# Patient Record
Sex: Female | Born: 1945 | Race: White | Hispanic: No | Marital: Married | Smoking: Never smoker
Health system: Southern US, Community
[De-identification: ages and names within clinical notes are randomized; demographics above are authoritative.]

## PROBLEM LIST (undated history)

## (undated) DIAGNOSIS — N950 Postmenopausal bleeding: Secondary | ICD-10-CM

## (undated) DIAGNOSIS — I10 Essential (primary) hypertension: Secondary | ICD-10-CM

## (undated) DIAGNOSIS — C4491 Basal cell carcinoma of skin, unspecified: Secondary | ICD-10-CM

## (undated) DIAGNOSIS — M1990 Unspecified osteoarthritis, unspecified site: Secondary | ICD-10-CM

## (undated) DIAGNOSIS — Z789 Other specified health status: Secondary | ICD-10-CM

## (undated) DIAGNOSIS — F32A Depression, unspecified: Secondary | ICD-10-CM

## (undated) DIAGNOSIS — F329 Major depressive disorder, single episode, unspecified: Secondary | ICD-10-CM

## (undated) DIAGNOSIS — E7800 Pure hypercholesterolemia, unspecified: Secondary | ICD-10-CM

## (undated) DIAGNOSIS — M85859 Other specified disorders of bone density and structure, unspecified thigh: Secondary | ICD-10-CM

## (undated) DIAGNOSIS — F419 Anxiety disorder, unspecified: Secondary | ICD-10-CM

## (undated) HISTORY — DX: Postmenopausal bleeding: N95.0

## (undated) HISTORY — PX: FOOT SURGERY: SHX648

## (undated) HISTORY — PX: TUBAL LIGATION: SHX77

## (undated) HISTORY — DX: Pure hypercholesterolemia, unspecified: E78.00

## (undated) HISTORY — DX: Basal cell carcinoma of skin, unspecified: C44.91

## (undated) HISTORY — PX: LASIK: SHX215

## (undated) HISTORY — DX: Other specified disorders of bone density and structure, unspecified thigh: M85.859

## (undated) HISTORY — PX: ENDOMETRIAL BIOPSY: SHX622

## (undated) HISTORY — PX: COLONOSCOPY: SHX174

---

## 1973-11-04 HISTORY — PX: OTHER SURGICAL HISTORY: SHX169

## 2000-03-05 ENCOUNTER — Other Ambulatory Visit: Admission: RE | Admit: 2000-03-05 | Discharge: 2000-03-05 | Payer: Self-pay | Admitting: Obstetrics and Gynecology

## 2001-03-25 ENCOUNTER — Other Ambulatory Visit: Admission: RE | Admit: 2001-03-25 | Discharge: 2001-03-25 | Payer: Self-pay | Admitting: Obstetrics and Gynecology

## 2002-03-25 ENCOUNTER — Other Ambulatory Visit: Admission: RE | Admit: 2002-03-25 | Discharge: 2002-03-25 | Payer: Self-pay | Admitting: Obstetrics and Gynecology

## 2003-04-27 ENCOUNTER — Other Ambulatory Visit: Admission: RE | Admit: 2003-04-27 | Discharge: 2003-04-27 | Payer: Self-pay | Admitting: Obstetrics and Gynecology

## 2003-08-29 ENCOUNTER — Ambulatory Visit (HOSPITAL_COMMUNITY): Admission: RE | Admit: 2003-08-29 | Discharge: 2003-08-29 | Payer: Self-pay | Admitting: Gastroenterology

## 2003-09-19 ENCOUNTER — Ambulatory Visit (HOSPITAL_COMMUNITY): Admission: RE | Admit: 2003-09-19 | Discharge: 2003-09-19 | Payer: Self-pay | Admitting: Obstetrics and Gynecology

## 2004-05-15 ENCOUNTER — Other Ambulatory Visit: Admission: RE | Admit: 2004-05-15 | Discharge: 2004-05-15 | Payer: Self-pay | Admitting: Obstetrics and Gynecology

## 2004-07-05 ENCOUNTER — Emergency Department (HOSPITAL_COMMUNITY): Admission: EM | Admit: 2004-07-05 | Discharge: 2004-07-05 | Payer: Self-pay | Admitting: Emergency Medicine

## 2004-11-23 ENCOUNTER — Ambulatory Visit (HOSPITAL_COMMUNITY): Admission: RE | Admit: 2004-11-23 | Discharge: 2004-11-23 | Payer: Self-pay | Admitting: Obstetrics and Gynecology

## 2005-07-19 ENCOUNTER — Other Ambulatory Visit: Admission: RE | Admit: 2005-07-19 | Discharge: 2005-07-19 | Payer: Self-pay | Admitting: Obstetrics and Gynecology

## 2005-12-26 ENCOUNTER — Ambulatory Visit (HOSPITAL_COMMUNITY): Admission: RE | Admit: 2005-12-26 | Discharge: 2005-12-26 | Payer: Self-pay | Admitting: Obstetrics and Gynecology

## 2006-08-20 ENCOUNTER — Other Ambulatory Visit: Admission: RE | Admit: 2006-08-20 | Discharge: 2006-08-20 | Payer: Self-pay | Admitting: Obstetrics and Gynecology

## 2006-12-30 ENCOUNTER — Ambulatory Visit (HOSPITAL_COMMUNITY): Admission: RE | Admit: 2006-12-30 | Discharge: 2006-12-30 | Payer: Self-pay | Admitting: Obstetrics and Gynecology

## 2007-09-23 ENCOUNTER — Other Ambulatory Visit: Admission: RE | Admit: 2007-09-23 | Discharge: 2007-09-23 | Payer: Self-pay | Admitting: Obstetrics and Gynecology

## 2008-01-19 ENCOUNTER — Ambulatory Visit (HOSPITAL_COMMUNITY): Admission: RE | Admit: 2008-01-19 | Discharge: 2008-01-19 | Payer: Self-pay | Admitting: Obstetrics and Gynecology

## 2009-01-17 ENCOUNTER — Other Ambulatory Visit: Admission: RE | Admit: 2009-01-17 | Discharge: 2009-01-17 | Payer: Self-pay | Admitting: Obstetrics and Gynecology

## 2009-01-19 ENCOUNTER — Ambulatory Visit (HOSPITAL_COMMUNITY): Admission: RE | Admit: 2009-01-19 | Discharge: 2009-01-19 | Payer: Self-pay | Admitting: Obstetrics and Gynecology

## 2010-02-16 ENCOUNTER — Emergency Department (HOSPITAL_COMMUNITY): Admission: EM | Admit: 2010-02-16 | Discharge: 2010-02-17 | Payer: Self-pay | Admitting: Emergency Medicine

## 2010-02-19 ENCOUNTER — Emergency Department (HOSPITAL_COMMUNITY): Admission: EM | Admit: 2010-02-19 | Discharge: 2010-02-19 | Payer: Self-pay | Admitting: Family Medicine

## 2010-05-09 ENCOUNTER — Ambulatory Visit (HOSPITAL_COMMUNITY): Admission: RE | Admit: 2010-05-09 | Discharge: 2010-05-09 | Payer: Self-pay | Admitting: Family Medicine

## 2011-03-22 NOTE — Op Note (Signed)
   NAMECARROL, Sharon Hardin                         ACCOUNT NO.:  [id number]   MEDICAL RECORD NO.:  [id number]                   PATIENT TYPE:  AMB   LOCATION:  ENDO                                 FACILITY:  [facility]   PHYSICIAN:  Anselmo Rod, M.D.               DATE OF BIRTH:  1946/03/09   DATE OF PROCEDURE:  08/29/2003  DATE OF DISCHARGE:                                 OPERATIVE REPORT   PROCEDURE:  Screening colonoscopy.   ENDOSCOPIST:  Anselmo Rod, M.D.   INSTRUMENT USED:  Olympus video colonoscope.   INDICATIONS FOR PROCEDURE:  Screening colonoscopy being performed in a 21-  year-old female,  rule out colonic polyps, masses, etc.   PREPROCEDURE PREPARATION:  Informed consent was obtained from the patient  and the patient was fasted for 8 hours prior to  the procedure and prepped  with a bottle of magnesium citrate and a gallon of GoLYTELY the  night prior  to the procedure.   PREPROCEDURE PHYSICAL:  The patient had stable vital signs. Neck supple.  Chest clear to auscultation, S1, S2 regular. Abdomen soft with normoactive  bowel sounds.   DESCRIPTION OF PROCEDURE:  The patient was placed in the left lateral  decubitus position and sedated with 100 mg of Demerol and 10 mg of Versed  intravenously. Once sedation was adequate, the patient was maintained on low  flow oxygen and continuous cardiac monitoring.   The Olympus video colonoscope was advanced from the rectum to the cecum. No  masses, polyps, erosions or ulcerations or diverticula were seen.  The  appendiceal orifice and ileocecal valve were clearly visualized and  photographed. Small internal hemorrhoids were appreciated on retroflexion in  the rectum. The patient tolerated the procedure well without complications.   IMPRESSION:  1. Normal colonoscopy to the cecum except for small  internal hemorrhoids.  2. No masses, polyps or diverticula were seen.   RECOMMENDATIONS:  1. Repeat colorectal cancer screening  is recommended in the next 10 years     unless the patient develops any abnormal symptoms in the interim.  2.     A high fiber diet with liberal fluid intake has been advised.  3. Outpatient follow up as need arises in the future.                                                Anselmo Rod, M.D.    JNM/MEDQ  D:  08/29/2003  T:  08/29/2003  Job:  [id number]   cc:   Aram Beecham P. Romine, M.D.  [address], [address]  [place]  [place] [address]  Fax: [phone]

## 2011-07-10 ENCOUNTER — Other Ambulatory Visit: Payer: Self-pay | Admitting: Obstetrics and Gynecology

## 2011-07-10 DIAGNOSIS — Z1231 Encounter for screening mammogram for malignant neoplasm of breast: Secondary | ICD-10-CM

## 2011-08-08 ENCOUNTER — Ambulatory Visit (HOSPITAL_COMMUNITY): Payer: Self-pay

## 2011-08-27 ENCOUNTER — Ambulatory Visit (HOSPITAL_COMMUNITY)
Admission: RE | Admit: 2011-08-27 | Discharge: 2011-08-27 | Disposition: A | Discharge status: Home / Self Care | Payer: PRIVATE HEALTH INSURANCE | Source: Ambulatory Visit | Attending: Obstetrics and Gynecology | Admitting: Obstetrics and Gynecology

## 2011-08-27 DIAGNOSIS — Z1231 Encounter for screening mammogram for malignant neoplasm of breast: Secondary | ICD-10-CM

## 2012-03-02 ENCOUNTER — Encounter (HOSPITAL_COMMUNITY): Payer: Self-pay | Admitting: Pharmacist

## 2012-03-02 NOTE — H&P (Signed)
Sharon Hardin is an 66 y.o. female.   Chief Complaint:bleeding HPI: [facility]  G2P2 with 15 days of bleeding and the end of March.  Had been on HRT from [facility] with implants, the last one being in Oct 2012.  On PUS uteris 9 x 4 x 5 cm and adnexa normal.  SHSG showed two polyps, one 1 cm and one 2 cm dia.    No past medical history on file.  No past surgical history on file.  No family history on file. Social History:  does not have a smoking history on file. She does not have any smokeless tobacco history on file. Her alcohol and drug histories not on file.Getting married in June 2013!  Allergies: No Known Allergies  No prescriptions prior to admission    No results found for this or any previous visit (from the past 48 hour(s)). No results found.  Review of Systems  All other systems reviewed and are negative.    There were no vitals taken for this visit. Physical Exam  Constitutional: She is oriented to person, place, and time. She appears well-developed and well-nourished.  HENT:  Head: Normocephalic and atraumatic.  Eyes: Conjunctivae are normal.  Neck: Neck supple. No thyromegaly present.  Cardiovascular: Normal rate, regular rhythm and normal heart sounds.   Respiratory: Effort normal and breath sounds normal.  GI: Soft.  Genitourinary: Vagina normal and uterus normal.  Musculoskeletal: Normal range of motion.  Neurological: She is alert and oriented to person, place, and time.  Skin: Skin is warm and dry.  Psychiatric: She has a normal mood and affect.     Assessment/Plan Post menopausal bleeding.  Plan hysteroscopic resection of polyps, D & C.  Sharon Hardin P 03/02/2012, 2:41 PM

## 2012-03-05 ENCOUNTER — Encounter (HOSPITAL_COMMUNITY): Payer: Self-pay

## 2012-03-05 ENCOUNTER — Encounter (HOSPITAL_COMMUNITY)
Admission: RE | Admit: 2012-03-05 | Discharge: 2012-03-05 | Disposition: A | Discharge status: Home / Self Care | Payer: BC Managed Care – PPO | Source: Ambulatory Visit | Attending: Obstetrics and Gynecology | Admitting: Obstetrics and Gynecology

## 2012-03-05 HISTORY — DX: Other specified health status: Z78.9

## 2012-03-05 LAB — CBC
HCT: 40.9 % (ref 36.0–46.0)
Hemoglobin: 13.2 g/dL (ref 12.0–15.0)
MCH: 30.6 pg (ref 26.0–34.0)
MCHC: 32.3 g/dL (ref 30.0–36.0)
MCV: 94.7 fL (ref 78.0–100.0)
Platelets: 382 10*3/uL (ref 150–400)
RBC: 4.32 MIL/uL (ref 3.87–5.11)
RDW: 13 % (ref 11.5–15.5)
WBC: 8.1 10*3/uL (ref 4.0–10.5)

## 2012-03-05 NOTE — Patient Instructions (Addendum)
20 Sharon Hardin  03/05/2012   Your procedure is scheduled on:  03/10/12  Enter through the Main Entrance of [facility] at 1030 AM.  Pick up the phone at the desk and dial [phone].   Call this number if you have problems the morning of surgery: [phone]   Remember:   Do not eat food:After Midnight.  Do not drink clear liquids: 4 Hours before arrival.  Take these medicines the morning of surgery with A SIP OF WATER: NA   Do not wear jewelry, make-up or nail polish.  Do not wear lotions, powders, or perfumes. You may wear deodorant.  Do not shave 48 hours prior to surgery.  Do not bring valuables to the hospital.  Contacts, dentures or bridgework may not be worn into surgery.  Leave suitcase in the car. After surgery it may be brought to your room.  For patients admitted to the hospital, checkout time is 11:00 AM the day of discharge.   Patients discharged the day of surgery will not be allowed to drive home.  Name and phone number of your driver: NA  Special Instructions: CHG Shower Use Special Wash: 1/2 bottle night before surgery and 1/2 bottle morning of surgery.   Please read over the following fact sheets that you were given: Surgical Site Infection Prevention

## 2012-03-10 ENCOUNTER — Encounter (HOSPITAL_COMMUNITY): Payer: Self-pay | Admitting: Anesthesiology

## 2012-03-10 ENCOUNTER — Encounter (HOSPITAL_COMMUNITY)
Admission: RE | Disposition: A | Discharge status: Home / Self Care | Payer: Self-pay | Source: Ambulatory Visit | Attending: Obstetrics and Gynecology

## 2012-03-10 ENCOUNTER — Ambulatory Visit (HOSPITAL_COMMUNITY)
Admission: RE | Admit: 2012-03-10 | Discharge: 2012-03-10 | Disposition: A | Discharge status: Home / Self Care | Payer: BC Managed Care – PPO | Source: Ambulatory Visit | Attending: Obstetrics and Gynecology | Admitting: Obstetrics and Gynecology

## 2012-03-10 ENCOUNTER — Ambulatory Visit (HOSPITAL_COMMUNITY): Payer: BC Managed Care – PPO | Admitting: Anesthesiology

## 2012-03-10 DIAGNOSIS — Z01818 Encounter for other preprocedural examination: Secondary | ICD-10-CM | POA: Insufficient documentation

## 2012-03-10 DIAGNOSIS — Z01812 Encounter for preprocedural laboratory examination: Secondary | ICD-10-CM | POA: Insufficient documentation

## 2012-03-10 DIAGNOSIS — N840 Polyp of corpus uteri: Secondary | ICD-10-CM | POA: Insufficient documentation

## 2012-03-10 DIAGNOSIS — N950 Postmenopausal bleeding: Secondary | ICD-10-CM

## 2012-03-10 HISTORY — PX: HYSTEROSCOPY: SHX211

## 2012-03-10 HISTORY — DX: Postmenopausal bleeding: N95.0

## 2012-03-10 SURGERY — DILATATION & CURETTAGE/HYSTEROSCOPY WITH RESECTOCOPE
Anesthesia: General | Site: Uterus | Wound class: Clean Contaminated

## 2012-03-10 MED ORDER — DEXAMETHASONE SODIUM PHOSPHATE 10 MG/ML IJ SOLN
INTRAMUSCULAR | Status: AC
  Filled 2012-03-10: qty 1

## 2012-03-10 MED ORDER — MIDAZOLAM HCL 2 MG/2ML IJ SOLN
INTRAMUSCULAR | Status: AC
  Filled 2012-03-10: qty 2

## 2012-03-10 MED ORDER — LIDOCAINE HCL 1 % IJ SOLN
INTRAMUSCULAR | Status: DC | PRN
  Administered 2012-03-10: 20 mL

## 2012-03-10 MED ORDER — PROPOFOL 10 MG/ML IV EMUL
INTRAVENOUS | Status: DC | PRN
  Administered 2012-03-10: 200 mg via INTRAVENOUS

## 2012-03-10 MED ORDER — MIDAZOLAM HCL 5 MG/5ML IJ SOLN
INTRAMUSCULAR | Status: DC | PRN
  Administered 2012-03-10: 2 mg via INTRAVENOUS

## 2012-03-10 MED ORDER — LIDOCAINE HCL (CARDIAC) 20 MG/ML IV SOLN
INTRAVENOUS | Status: AC
  Filled 2012-03-10: qty 5

## 2012-03-10 MED ORDER — FENTANYL CITRATE 0.05 MG/ML IJ SOLN
INTRAMUSCULAR | Status: DC | PRN
  Administered 2012-03-10 (×2): 50 ug via INTRAVENOUS

## 2012-03-10 MED ORDER — KETOROLAC TROMETHAMINE 30 MG/ML IJ SOLN
INTRAMUSCULAR | Status: DC | PRN
  Administered 2012-03-10: 30 mg via INTRAVENOUS

## 2012-03-10 MED ORDER — FENTANYL CITRATE 0.05 MG/ML IJ SOLN
25.0000 ug | INTRAMUSCULAR | Status: DC | PRN
  Administered 2012-03-10: 50 ug via INTRAVENOUS

## 2012-03-10 MED ORDER — LIDOCAINE HCL (CARDIAC) 20 MG/ML IV SOLN
INTRAVENOUS | Status: DC | PRN
  Administered 2012-03-10: 60 mg via INTRAVENOUS

## 2012-03-10 MED ORDER — FENTANYL CITRATE 0.05 MG/ML IJ SOLN
INTRAMUSCULAR | Status: AC
  Filled 2012-03-10: qty 2

## 2012-03-10 MED ORDER — GLYCOPYRROLATE 0.2 MG/ML IJ SOLN
INTRAMUSCULAR | Status: AC
  Filled 2012-03-10: qty 1

## 2012-03-10 MED ORDER — ONDANSETRON HCL 4 MG/2ML IJ SOLN
INTRAMUSCULAR | Status: AC
  Filled 2012-03-10: qty 2

## 2012-03-10 MED ORDER — GLYCINE 1.5 % IR SOLN
Status: DC | PRN
  Administered 2012-03-10: 3000 mL

## 2012-03-10 MED ORDER — PROPOFOL 10 MG/ML IV EMUL
INTRAVENOUS | Status: AC
  Filled 2012-03-10: qty 20

## 2012-03-10 MED ORDER — FENTANYL CITRATE 0.05 MG/ML IJ SOLN
INTRAMUSCULAR | Status: AC
  Filled 2012-03-10: qty 5

## 2012-03-10 MED ORDER — FENTANYL CITRATE 0.05 MG/ML IJ SOLN
INTRAMUSCULAR | Status: AC
  Administered 2012-03-10: 50 ug via INTRAVENOUS
  Filled 2012-03-10: qty 2

## 2012-03-10 MED ORDER — LACTATED RINGERS IV SOLN
INTRAVENOUS | Status: DC
  Administered 2012-03-10: 11:00:00 via INTRAVENOUS

## 2012-03-10 MED ORDER — KETOROLAC TROMETHAMINE 30 MG/ML IJ SOLN: 15.0000 mg | Freq: Once | INTRAMUSCULAR | Status: DC | PRN

## 2012-03-10 MED ORDER — ONDANSETRON HCL 4 MG/2ML IJ SOLN
INTRAMUSCULAR | Status: DC | PRN
  Administered 2012-03-10: 4 mg via INTRAVENOUS

## 2012-03-10 MED ORDER — DEXAMETHASONE SODIUM PHOSPHATE 4 MG/ML IJ SOLN
INTRAMUSCULAR | Status: DC | PRN
  Administered 2012-03-10: 10 mg via INTRAVENOUS

## 2012-03-10 SURGICAL SUPPLY — 19 items
CANISTER SUCTION 2500CC (MISCELLANEOUS) ×2 IMPLANT
CATH ROBINSON RED A/P 16FR (CATHETERS) ×2 IMPLANT
CONTAINER PREFILL 10% NBF 60ML (FORM) ×3 IMPLANT
CORD ACTIVE DISPOSABLE (ELECTRODE) ×1
CORD ELECTRO ACTIVE DISP (ELECTRODE) ×1 IMPLANT
DECANTER SPIKE VIAL GLASS SM (MISCELLANEOUS) ×1 IMPLANT
ELECT LOOP GYNE PRO 24FR (CUTTING LOOP) ×2
ELECT REM PT RETURN 9FT ADLT (ELECTROSURGICAL) ×2
ELECT VAPORTRODE GRVD BAR (ELECTRODE) IMPLANT
ELECTRODE LOOP GYNE PRO 24FR (CUTTING LOOP) IMPLANT
ELECTRODE REM PT RTRN 9FT ADLT (ELECTROSURGICAL) ×1 IMPLANT
GLOVE BIOGEL PI IND STRL 7.0 (GLOVE) ×2 IMPLANT
GLOVE BIOGEL PI INDICATOR 7.0 (GLOVE) ×2
GLOVE ECLIPSE 6.5 STRL STRAW (GLOVE) ×2 IMPLANT
GOWN PREVENTION PLUS LG XLONG (DISPOSABLE) ×3 IMPLANT
GOWN STRL REIN XL XLG (GOWN DISPOSABLE) ×2 IMPLANT
PACK HYSTEROSCOPY LF (CUSTOM PROCEDURE TRAY) ×2 IMPLANT
TOWEL OR 17X24 6PK STRL BLUE (TOWEL DISPOSABLE) ×4 IMPLANT
WATER STERILE IRR 1000ML POUR (IV SOLUTION) ×2 IMPLANT

## 2012-03-10 NOTE — Op Note (Signed)
Pre-Op Dx: Post menopausal bleeding, endometrial polyps on sonohystogram Post-Op Dx:  Same, path pending Procedure:  Hysteroscopy with resection of polyps, D & C Surgeon:  Aram Beecham Latima Hamza Anes:  Gen by LMA EBL: minimal Complications: none Glycine deficit:  105 cc Procedure:  The patient was taken to the operating room, and after the induction of adequate general anesthesia by LMA, she was placed in the dorsal lithotomy position and prepped and draped in the usual fashion.  The bladder was drained with a red rubber catheter during the prep.  A posterior weighted and anterior Sims retractor were placed, and the cervix was grasped on the anterior lip with a single tooth tenaculum.  Paracervical block was instituted by injecting 10 cc of 1% xylocaine at each of 3 and 9 o'clock.  The uterus sounded to 8.5 cm   .  The cervix was dilated to a # 31 Pratt.  Polyp forceps were introduced and segments to two polyps were removed.   The operative hysteroscope with a single loop was inserted into the endometrial cavity under direct visualization.  Cautery was used to remove several segments of remaining polyps, and the scope was withdrawn.  Gentle sharp curettage was then carried out, and the specimen sent to pathology.  The instruments were removed from the vagina, and the procedure was terminated.  Sponge, needle, and instrument counts were correct.  The patient tolerated the procedure well, and went to the recovery room in satisfactory condition.

## 2012-03-10 NOTE — Discharge Instructions (Signed)
Hysteroscopy  Hysteroscopy is a procedure used for looking inside the womb (uterus). It may be done for many different reasons.  AFTER THE PROCEDURE  If you had a general anesthetic, you may be groggy for a couple hours after the procedure.   If you had a local anesthetic, you will be advised to rest at the surgical center or caregiver's office until you are stable and feel ready to go home.   You may have some cramping for a couple days.   You may have bleeding, which varies from light spotting for a few days to menstrual-like bleeding for up to 3 to 7 days. This is normal.   Have someone take you home.    HOME CARE INSTRUCTIONS  Do not drive for 24 hours or as instructed.   Only take over-the-counter or prescription medicines for pain, discomfort, or fever as directed by your caregiver.   Do not take aspirin. It can cause or aggravate bleeding.   Do not drive or drink alcohol while taking pain medicine.   You may resume your usual diet.   Do not use tampons, douche, or have sexual intercourse for 2 weeks, or as advised by your caregiver.   Rest and sleep for the first 24 to 48 hours.   Take your temperature twice a day for 4 to 5 days. Write it down. Give these temperatures to your caregiver if they are abnormal (above 98.6 F or 37.0 C).   Take medicines your caregiver has ordered as directed.   Follow your caregiver's advice regarding diet, exercise, lifting, driving, and general activities.   Take showers instead of baths for 2 weeks, or as recommended by your caregiver.   If you develop constipation: Take a mild laxative with the advice of your caregiver.   Eat bran foods.   Drink enough water and fluids to keep your urine clear or pale yellow.   Try to have someone with you or available to you for the first 24 to 48 hours, especially if you had a general anesthetic.   Make sure you and your family understand everything about your operation and recovery.    Follow your caregiver's advice regarding follow-up appointments.   FINDING OUT THE RESULTS OF YOUR TEST Not all test results are available during your visit. If your test results are not back during the visit, make an appointment with your caregiver to find out the results. Do not assume everything is normal if you have not heard from your caregiver or the medical facility. It is important for you to follow up on all of your test results.  SEEK MEDICAL CARE IF:  You feel dizzy or lightheaded.   You feel sick to your stomach (nauseous).   You develop abnormal vaginal discharge.   You develop a rash.   You have an abnormal reaction or allergy to your medicine.   You need stronger pain medicine.    SEEK IMMEDIATE MEDICAL CARE IF:  Bleeding is heavier than a normal menstrual period   You have an oral temperature above 100.5 not controlled by medicine.   You have increasing cramps or pains not relieved with medicine.   You develop belly (abdominal) pain that does not seem to be related to the same area of earlier cramping and pain.   You pass out.   You develop shortness of breath.   MAKE SURE YOU:  Understand these instructions.   Will watch your condition.   Will get help right away if  you are not doing well or get worse.

## 2012-03-10 NOTE — Transfer of Care (Signed)
Immediate Anesthesia Transfer of Care Note  Patient: Sharon Hardin  Procedure(s) Performed: Procedure(s) (LRB): DILATATION & CURETTAGE/HYSTEROSCOPY WITH RESECTOCOPE (N/A)  Patient Location: PACU  Anesthesia Type: General  Level of Consciousness: awake, alert  and oriented  Airway & Oxygen Therapy: Patient Spontanous Breathing and Patient connected to nasal cannula oxygen  Post-op Assessment: Report given to PACU RN and Post -op Vital signs reviewed and stable  Post vital signs: Reviewed and stable  Complications: No apparent anesthesia complications

## 2012-03-10 NOTE — Anesthesia Preprocedure Evaluation (Signed)
Anesthesia Evaluation  Patient identified by MRN, date of birth, ID band Patient awake    Reviewed: Allergy & Precautions, H&P , NPO status , Patient's Chart, lab work & pertinent test results, reviewed documented beta blocker date and time   History of Anesthesia Complications Negative for: history of anesthetic complications  Airway Mallampati: II TM Distance: >3 FB Neck ROM: full  Mouth opening: Limited Mouth Opening  Dental  (+) Edentulous Upper, Partial Lower and Upper Dentures   Pulmonary neg pulmonary ROS,  breath sounds clear to auscultation  Pulmonary exam normal       Cardiovascular Exercise Tolerance: Good negative cardio ROS  Rhythm:regular Rate:Normal     Neuro/Psych negative neurological ROS  negative psych ROS   GI/Hepatic negative GI ROS, Neg liver ROS,   Endo/Other  negative endocrine ROS  Renal/GU negative Renal ROS  Female GU complaint     Musculoskeletal   Abdominal   Peds  Hematology negative hematology ROS (+)   Anesthesia Other Findings   Reproductive/Obstetrics negative OB ROS                           Anesthesia Physical Anesthesia Plan  ASA: I  Anesthesia Plan: General LMA   Post-op Pain Management:    Induction:   Airway Management Planned:   Additional Equipment:   Intra-op Plan:   Post-operative Plan:   Informed Consent: I have reviewed the patients History and Physical, chart, labs and discussed the procedure including the risks, benefits and alternatives for the proposed anesthesia with the patient or authorized representative who has indicated his/her understanding and acceptance.   Dental Advisory Given  Plan Discussed with: CRNA and Surgeon  Anesthesia Plan Comments:         Anesthesia Quick Evaluation

## 2012-03-10 NOTE — Interval H&P Note (Signed)
History and Physical Interval Note:  03/10/2012 11:45 AM  Sharon Hardin  has presented today for surgery, with the diagnosis of PMB  The various methods of treatment have been discussed with the patient and family. After consideration of risks, benefits and other options for treatment, the patient has consented to  Procedure(s) (LRB): DILATATION & CURETTAGE/HYSTEROSCOPY WITH RESECTOCOPE (N/A) as a surgical intervention .  The patients' history has been reviewed, patient examined, no change in status, stable for surgery.  I have reviewed the patients' chart and labs.  Questions were answered to the patient's satisfaction.     Marceil Welp P

## 2012-03-10 NOTE — Anesthesia Procedure Notes (Signed)
Procedure Name: LMA Insertion Date/Time: 03/10/2012 12:12 PM Performed by: Graciela Husbands Pre-anesthesia Checklist: Suction available, Emergency Drugs available, Timeout performed, Patient identified and Patient being monitored Patient Re-evaluated:Patient Re-evaluated prior to inductionOxygen Delivery Method: Circle system utilized Preoxygenation: Pre-oxygenation with 100% oxygen Intubation Type: IV induction Ventilation: Mask ventilation without difficulty LMA: LMA inserted LMA Size: 4.0 Number of attempts: 1 Placement Confirmation: positive ETCO2 and breath sounds checked- equal and bilateral Tube secured with: Tape Dental Injury: Teeth and Oropharynx as per pre-operative assessment

## 2012-03-10 NOTE — Anesthesia Postprocedure Evaluation (Signed)
Anesthesia Post Note  Patient: Sharon Hardin  Procedure(s) Performed: Procedure(s) (LRB): DILATATION & CURETTAGE/HYSTEROSCOPY WITH RESECTOCOPE (N/A)  Anesthesia type: General  Patient location: PACU  Post pain: Pain level controlled  Post assessment: Post-op Vital signs reviewed  Last Vitals:  Filed Vitals:   03/10/12 1345  BP: 141/63  Pulse: 62  Temp: 36.6 C  Resp: 15    Post vital signs: Reviewed  Level of consciousness: sedated  Complications: No apparent anesthesia complications

## 2012-07-27 ENCOUNTER — Other Ambulatory Visit: Payer: Self-pay | Admitting: Dermatology

## 2013-04-08 ENCOUNTER — Encounter: Payer: Self-pay | Admitting: Obstetrics and Gynecology

## 2013-04-09 ENCOUNTER — Ambulatory Visit (INDEPENDENT_AMBULATORY_CARE_PROVIDER_SITE_OTHER): Payer: 59 | Admitting: Obstetrics and Gynecology

## 2013-04-09 ENCOUNTER — Encounter: Payer: Self-pay | Admitting: Obstetrics and Gynecology

## 2013-04-09 VITALS — BP 136/70 | Ht 63.5 in | Wt 180.0 lb

## 2013-04-09 DIAGNOSIS — Z01419 Encounter for gynecological examination (general) (routine) without abnormal findings: Secondary | ICD-10-CM

## 2013-04-09 MED ORDER — ESCITALOPRAM OXALATE 10 MG PO TABS: 10.0000 mg | ORAL_TABLET | Freq: Every day | ORAL | Status: DC

## 2013-04-09 NOTE — Patient Instructions (Signed)

## 2013-04-09 NOTE — Progress Notes (Signed)
67 y.o.  Married   Caucasian   female   G2P2   here for annual exam.  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 Patient is postmenopausal.          Sexually active: yes  The current method of family planning is tubal ligation and post menopausal status.    Exercising: no Last mammogram:  08/27/11 neg encouraged to go Last pap smear:04/07/12 neg History of abnormal pap: no Smoking:never Alcohol:2-3 times a week(wine) Last colonoscopy:2005 normal repeat in 10 years Last Bone Density:  2011 osteopenia(thinning in the hips) Last tetanus shot: 2008 Last cholesterol check: 2011 total 232, LDL 153  Hgb:   pcp             Urine:pcp   Family History  Problem Relation Age of Onset  . Hypertension Mother   . Heart failure Father     There are no active problems to display for this patient.   Past Medical History  Diagnosis Date  . No pertinent past medical history   . Elevated cholesterol   . PMB (postmenopausal bleeding) 03/10/2012    Past Surgical History  Procedure Laterality Date  . Tubal ligation    . Foot surgery    . Lasik    . Endometrial biopsy      simple hyperplasia  . Cold knife conization  1975    abnormal pap  . Hysteroscopy  03/10/12    resect, D&C secondary PMB    Allergies: Review of patient's allergies indicates no known allergies.  Current Outpatient Prescriptions  Medication Sig Dispense Refill  . CALCIUM PO Take 600 mg by mouth 2 (two) times daily.      Marland Kitchen escitalopram (LEXAPRO) 10 MG tablet daily.       . Multiple Vitamin (MULITIVITAMIN WITH MINERALS) TABS Take 1 tablet by mouth every morning.      . progesterone (PROMETRIUM) 200 MG capsule daily.        No current facility-administered medications for this visit.    ROS: Pertinent items are noted in HPI.  Social Hx:  Married, two children, recently remarried (last June)  Exam:    BP 136/70  Ht 5' 3.5" (1.613 m)  Wt 180 lb (81.647 kg)  BMI 31.38  kg/m2  Ht stable, wt up 17 pounds from last year Wt Readings from Last 3 Encounters:  04/09/13 180 lb (81.647 kg)  03/05/12 157 lb (71.215 kg)     Ht Readings from Last 3 Encounters:  04/09/13 5' 3.5" (1.613 m)  03/05/12 5\' 4"  (1.626 m)    General appearance: alert, cooperative and appears stated age Head: Normocephalic, without obvious abnormality, atraumatic Neck: no adenopathy, supple, symmetrical, trachea midline and thyroid not enlarged, symmetric, no tenderness/mass/nodules Lungs: clear to auscultation bilaterally Breasts: Inspection negative, No nipple retraction or dimpling, No nipple discharge or bleeding, No axillary or supraclavicular adenopathy, Normal to palpation without dominant masses Heart: regular rate and rhythm Abdomen: soft, non-tender; bowel sounds normal; no masses,  no organomegaly Extremities: extremities normal, atraumatic, no cyanosis or edema Skin: Skin color, texture, turgor normal. No rashes or lesions Lymph nodes: Cervical, supraclavicular, and axillary nodes normal. No abnormal inguinal nodes palpated Neurologic: Grossly normal   Pelvic: External genitalia:  no lesions              Urethra:  normal appearing urethra with no masses, tenderness or lesions  Bartholins and Skenes: normal                 Vagina: normal appearing vagina with normal color and discharge, no lesions              Cervix: normal appearance              Pap taken: no        Bimanual Exam:  Uterus:  uterus is normal size, shape, consistency and nontender                                      Adnexa: normal adnexa in size, nontender and no masses                                      Rectovaginal: Confirms                                      Anus:  normal sphincter tone, no lesions  A: normal menopausal exam, no HRT     PMB in Nov 2013, recurrent, bx showed simple hyperplasia, treating with P4     Widowed in 2006, remarried in 2013, living now in [place], [place] at [facility]     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  Stop P4 now that it's been about 7 months of tx and the PMB was felt to be d/t E2 pellets from the [facility] that she no longer uses.     An After Visit Summary was printed and given to the patient.

## 2013-07-28 ENCOUNTER — Other Ambulatory Visit: Payer: Self-pay | Admitting: Dermatology

## 2013-09-21 ENCOUNTER — Other Ambulatory Visit: Payer: Self-pay | Admitting: Obstetrics and Gynecology

## 2013-09-21 DIAGNOSIS — Z1231 Encounter for screening mammogram for malignant neoplasm of breast: Secondary | ICD-10-CM

## 2013-10-06 ENCOUNTER — Other Ambulatory Visit: Payer: Self-pay | Admitting: Obstetrics and Gynecology

## 2013-10-06 ENCOUNTER — Ambulatory Visit (HOSPITAL_COMMUNITY)
Admission: RE | Admit: 2013-10-06 | Discharge: 2013-10-06 | Disposition: A | Discharge status: Home / Self Care | Payer: 59 | Source: Ambulatory Visit | Attending: Obstetrics and Gynecology | Admitting: Obstetrics and Gynecology

## 2013-10-06 DIAGNOSIS — Z1231 Encounter for screening mammogram for malignant neoplasm of breast: Secondary | ICD-10-CM | POA: Insufficient documentation

## 2014-03-25 ENCOUNTER — Other Ambulatory Visit: Payer: Self-pay | Admitting: *Deleted

## 2014-03-25 MED ORDER — ESCITALOPRAM OXALATE 10 MG PO TABS: 10.0000 mg | ORAL_TABLET | Freq: Every day | ORAL | Status: DC

## 2014-03-25 NOTE — Telephone Encounter (Signed)
Last AEX and refill 04/09/13 #90/ 3 refills Next appt 04/11/14  Please approve or deny Rx.

## 2014-04-11 ENCOUNTER — Ambulatory Visit: Payer: 59 | Admitting: Obstetrics and Gynecology

## 2014-06-01 ENCOUNTER — Encounter: Payer: Self-pay | Admitting: Obstetrics and Gynecology

## 2014-06-01 ENCOUNTER — Ambulatory Visit (INDEPENDENT_AMBULATORY_CARE_PROVIDER_SITE_OTHER): Payer: Medicare Other | Admitting: Obstetrics and Gynecology

## 2014-06-01 VITALS — BP 126/60 | HR 68 | Ht 63.5 in | Wt 166.0 lb

## 2014-06-01 DIAGNOSIS — Z23 Encounter for immunization: Secondary | ICD-10-CM

## 2014-06-01 DIAGNOSIS — M949 Disorder of cartilage, unspecified: Secondary | ICD-10-CM

## 2014-06-01 DIAGNOSIS — Z01419 Encounter for gynecological examination (general) (routine) without abnormal findings: Secondary | ICD-10-CM

## 2014-06-01 DIAGNOSIS — M899 Disorder of bone, unspecified: Secondary | ICD-10-CM

## 2014-06-01 DIAGNOSIS — M8580 Other specified disorders of bone density and structure, unspecified site: Secondary | ICD-10-CM

## 2014-06-01 NOTE — Progress Notes (Signed)
GYNECOLOGY VISIT  PCP: Dr. Claudia Pollock  Referring provider:   HPI: 68 y.o.   Widowed  Caucasian  female   G2P2 with No LMP recorded. Patient is postmenopausal.   here for   Annual Not on HRT No bleeding or spotting.   Hgb:  PCP. Urine:  Normal, ph 5.0  GYNECOLOGIC HISTORY: No LMP recorded. Patient is postmenopausal. Sexually active:  yes Partner preference: female Contraception:   none Menopausal hormone therapy: no  DES exposure:   no Blood transfusions:no    Sexually transmitted diseases:no    GYN procedures and prior surgeries: Tubal Ligation, Endometrial Biopsy, Hysteroscopy  Last mammogram:   10/07/13 BI-RADS1 neg              Last pap and high risk HPV testing:04/07/12 HR HPV neg    History of abnormal pap smear:  No   OB History   Grav Para Term Preterm Abortions TAB SAB Ect Mult Living   2 2        2        LIFESTYLE: Exercise:  no             Tobacco: no Alcohol:no Drug use:no    OTHER HEALTH MAINTENANCE: Tetanus/TDap:2005 Gardisil:no Influenza: 10/14  Zostavax:no  Bone density:05/11/2010 - osteopenia of bilateral hips.  Colonoscopy:2005, pt due  Cholesterol check: pcp  Family History  Problem Relation Age of Onset  . Hypertension Mother   . Heart failure Father     There are no active problems to display for this patient.  Past Medical History  Diagnosis Date  . No pertinent past medical history   . Elevated cholesterol   . PMB (postmenopausal bleeding) 03/10/2012    Past Surgical History  Procedure Laterality Date  . Tubal ligation    . Foot surgery    . Lasik    . Endometrial biopsy      simple hyperplasia  . Cold knife conization  1975    abnormal pap  . Hysteroscopy  03/10/12    resect, D&C secondary PMB    ALLERGIES: Review of patient's allergies indicates no known allergies.  Current Outpatient Prescriptions  Medication Sig Dispense Refill  . CALCIUM PO Take 600 mg by mouth 2 (two) times daily.      Marland Kitchen escitalopram  (LEXAPRO) 10 MG tablet Take 1 tablet (10 mg total) by mouth daily.  90 tablet  0  . Multiple Vitamin (MULITIVITAMIN WITH MINERALS) TABS Take 1 tablet by mouth every morning.       No current facility-administered medications for this visit.     ROS:  Pertinent items are noted in HPI.  SOCIAL HISTORY:  Retired.   PHYSICAL EXAMINATION:    BP 126/60  Pulse 68  Ht 5' 3.5" (1.613 m)  Wt 166 lb (75.297 kg)  BMI 28.94 kg/m2   Wt Readings from Last 3 Encounters:  06/01/14 166 lb (75.297 kg)  04/09/13 180 lb (81.647 kg)  03/05/12 157 lb (71.215 kg)     Ht Readings from Last 3 Encounters:  06/01/14 5' 3.5" (1.613 m)  04/09/13 5' 3.5" (1.613 m)  03/05/12 5\' 4"  (1.626 m)    General appearance: alert, cooperative and appears stated age Head: Normocephalic, without obvious abnormality, atraumatic Neck: no adenopathy, supple, symmetrical, trachea midline and thyroid not enlarged, symmetric, no tenderness/mass/nodules Lungs: clear to auscultation bilaterally Breasts: Inspection negative, No nipple retraction or dimpling, No nipple discharge or bleeding, No axillary or supraclavicular adenopathy, Normal to palpation without dominant masses Heart:  regular rate and rhythm Abdomen: soft, non-tender; no masses,  no organomegaly Extremities: extremities normal, atraumatic, no cyanosis or edema Skin: Skin color, texture, turgor normal. No rashes or lesions Lymph nodes: Cervical, supraclavicular, and axillary nodes normal. No abnormal inguinal nodes palpated Neurologic: Grossly normal  Pelvic: External genitalia:  no lesions              Urethra:  normal appearing urethra with no masses, tenderness or lesions              Bartholins and Skenes: normal                 Vagina: normal appearing vagina with normal color and discharge, no lesions              Cervix: normal appearance              Pap and high risk HPV testing done: No..            Bimanual Exam:  Uterus:  uterus is normal size,  shape, consistency and nontender                                      Adnexa: normal adnexa in size, nontender and no masses                                      Rectovaginal: Confirms                                      Anus:  normal sphincter tone, no lesions  ASSESSMENT  Normal gynecologic exam. Osteopenia.   PLAN  Mammogram recommended yearly.  Pap smear and high risk HPV testing not indicated.  Bone density recommended.  Colonoscopy recommended.  TDap given.  Counseled on self breast exam, Calcium and vitamin D intake, exercise. Return annually or prn   An After Visit Summary was printed and given to the patient.

## 2014-06-01 NOTE — Patient Instructions (Signed)
Shingles Vaccine What You Need to Know WHAT IS SHINGLES?  Shingles is a painful skin rash, often with blisters. It is also called Herpes Zoster or just Zoster.  A shingles rash usually appears on one side of the face or body and lasts from 2 to 4 weeks. Its main symptom is pain, which can be quite severe. Other symptoms of shingles can include fever, headache, chills, and upset stomach. Very rarely, a shingles infection can lead to pneumonia, hearing problems, blindness, brain inflammation (encephalitis), or death.  For about 1 person in 5, severe pain can continue even after the rash clears up. This is called post-herpetic neuralgia.  Shingles is caused by the Varicella Zoster virus. This is the same virus that causes chickenpox. Only someone who has had a case of chickenpox or rarely, has gotten chickenpox vaccine, can get shingles. The virus stays in your body. It can reappear many years later to cause a case of shingles.  You cannot catch shingles from another person with shingles. However, a person who has never had chickenpox (or chickenpox vaccine) could get chickenpox from someone with shingles. This is not very common.  Shingles is far more common in people 50 and older than in younger people. It is also more common in people whose immune systems are weakened because of a disease such as cancer or drugs such as steroids or chemotherapy.  At least 1 million people get shingles per year in the [place]. SHINGLES VACCINE  A vaccine for shingles was licensed in 2006. In clinical trials, the vaccine reduced the risk of shingles by 50%. It can also reduce the pain in people who still get shingles after being vaccinated.  A single dose of shingles vaccine is recommended for adults 66 years of age and older. SOME PEOPLE SHOULD NOT GET SHINGLES VACCINE OR SHOULD WAIT A person should not get shingles vaccine if he or she:  Has ever had a life-threatening allergic reaction to gelatin, the  antibiotic neomycin, or any other component of shingles vaccine. Tell your caregiver if you have any severe allergies.  Has a weakened immune system because of current:  AIDS or another disease that affects the immune system.  Treatment with drugs that affect the immune system, such as prolonged use of high-dose steroids.  Cancer treatment, such as radiation or chemotherapy.  Cancer affecting the bone marrow or lymphatic system, such as leukemia or lymphoma.  Is pregnant, or might be pregnant. Women should not become pregnant until at least 4 weeks after getting shingles vaccine. Someone with a minor illness, such as a cold, may be vaccinated. Anyone with a moderate or severe acute illness should usually wait until he or she recovers before getting the vaccine. This includes anyone with a temperature of 101.3 F (38 C) or higher. WHAT ARE THE RISKS FROM SHINGLES VACCINE?  A vaccine, like any medicine, could possibly cause serious problems, such as severe allergic reactions. However, the risk of a vaccine causing serious harm, or death, is extremely small.  No serious problems have been identified with shingles vaccine. Mild Problems  Redness, soreness, swelling, or itching at the site of the injection (about 1 person in 3).  Headache (about 1 person in 70). Like all vaccines, shingles vaccine is being closely monitored for unusual or severe problems. WHAT IF THERE IS A MODERATE OR SEVERE REACTION? What should I look for? Any unusual condition, such as a severe allergic reaction or a high fever. If a severe allergic reaction   occurred, it would be within a few minutes to an hour after the shot. Signs of a serious allergic reaction can include difficulty breathing, weakness, hoarseness or wheezing, a fast heartbeat, hives, dizziness, paleness, or swelling of the throat. What should I do?  Call your caregiver, or get the person to a caregiver right away.  Tell the caregiver what  happened, the date and time it happened, and when the vaccination was given.  Ask the caregiver to report the reaction by filing a Vaccine Adverse Event Reporting System (VAERS) form. Or, you can file this report through the VAERS web site at www.vaers.SamedayNews.es or by calling 805-586-9903. VAERS does not provide medical advice. HOW CAN I LEARN MORE?  Ask your caregiver. He or she can give you the vaccine package insert or suggest other sources of information.  Contact the Centers for Disease Control and Prevention (CDC):  Call 902-158-8529 (1-800-CDC-INFO).  Visit the CDC website at http://hunter.com/ CDC Shingles Vaccine VIS (08/09/08) Document Released: 08/18/2006 Document Revised: 01/13/2012 Document Reviewed: 02/10/2013 [facility] Patient Information 2015 [place]. This information is not intended to replace advice given to you by your health care provider. Make sure you discuss any questions you have with your health care provider.  Pneumococcal Vaccine, Polyvalent solution for injection What is this medicine? PNEUMOCOCCAL VACCINE, POLYVALENT (NEU mo KOK al vak SEEN, pol ee VEY luhnt) is a vaccine to prevent pneumococcus bacteria infection. These bacteria are a major cause of ear infections, Strep throat infections, and serious pneumonia, meningitis, or blood infections worldwide. These vaccines help the body to produce antibodies (protective substances) that help your body defend against these bacteria. This vaccine is recommended for people 66 years of age and older with health problems. It is also recommended for all adults over 66 years old. This vaccine will not treat an infection. This medicine may be used for other purposes; ask your health care provider or pharmacist if you have questions. COMMON BRAND NAME(S): Pneumovax 23 What should I tell my health care provider before I take this medicine? They need to know if you have any of these conditions: -bleeding  problems -bone marrow or organ transplant -cancer, Hodgkin's disease -fever -infection -immune system problems -low platelet count in the blood -seizures -an unusual or allergic reaction to pneumococcal vaccine, diphtheria toxoid, other vaccines, latex, other medicines, foods, dyes, or preservatives -pregnant or trying to get pregnant -breast-feeding How should I use this medicine? This vaccine is for injection into a muscle or under the skin. It is given by a health care professional. A copy of Vaccine Information Statements will be given before each vaccination. Read this sheet carefully each time. The sheet may change frequently. Talk to your pediatrician regarding the use of this medicine in children. While this drug may be prescribed for children as young as 79 years of age for selected conditions, precautions do apply. Overdosage: If you think you have taken too much of this medicine contact a poison control center or emergency room at once. NOTE: This medicine is only for you. Do not share this medicine with others. What if I miss a dose? It is important not to miss your dose. Call your doctor or health care professional if you are unable to keep an appointment. What may interact with this medicine? -medicines for cancer chemotherapy -medicines that suppress your immune function -medicines that treat or prevent blood clots like warfarin, enoxaparin, and dalteparin -steroid medicines like prednisone or cortisone This list may not describe all possible interactions.  Give your health care provider a list of all the medicines, herbs, non-prescription drugs, or dietary supplements you use. Also tell them if you smoke, drink alcohol, or use illegal drugs. Some items may interact with your medicine. What should I watch for while using this medicine? Mild fever and pain should go away in 3 days or less. Report any unusual symptoms to your doctor or health care professional. What side effects  may I notice from receiving this medicine? Side effects that you should report to your doctor or health care professional as soon as possible: -allergic reactions like skin rash, itching or hives, swelling of the face, lips, or tongue -breathing problems -confused -fever over 102 degrees F -pain, tingling, numbness in the hands or feet -seizures -unusual bleeding or bruising -unusual muscle weakness Side effects that usually do not require medical attention (report to your doctor or health care professional if they continue or are bothersome): -aches and pains -diarrhea -fever of 102 degrees F or less -headache -irritable -loss of appetite -pain, tender at site where injected -trouble sleeping This list may not describe all possible side effects. Call your doctor for medical advice about side effects. You may report side effects to FDA at 1-800-FDA-1088. Where should I keep my medicine? This does not apply. This vaccine is given in a clinic, pharmacy, doctor's office, or other health care setting and will not be stored at home. NOTE: This sheet is a summary. It may not cover all possible information. If you have questions about this medicine, talk to your doctor, pharmacist, or health care provider.  2015, Elsevier/Gold Standard. (2008-05-27 14:32:37)  EXERCISE AND DIET:  We recommended that you start or continue a regular exercise program for good health. Regular exercise means any activity that makes your heart beat faster and makes you sweat.  We recommend exercising at least 30 minutes per day at least 3 days a week, preferably 4 or 5.  We also recommend a diet low in fat and sugar.  Inactivity, poor dietary choices and obesity can cause diabetes, heart attack, stroke, and kidney damage, among others.    ALCOHOL AND SMOKING:  Women should limit their alcohol intake to no more than 7 drinks/beers/glasses of wine (combined, not each!) per week. Moderation of alcohol intake to this level  decreases your risk of breast cancer and liver damage. And of course, no recreational drugs are part of a healthy lifestyle.  And absolutely no smoking or even second hand smoke. Most people know smoking can cause heart and lung diseases, but did you know it also contributes to weakening of your bones? Aging of your skin?  Yellowing of your teeth and nails?  CALCIUM AND VITAMIN D:  Adequate intake of calcium and Vitamin D are recommended.  The recommendations for exact amounts of these supplements seem to change often, but generally speaking 600 mg of calcium (either carbonate or citrate) and 800 units of Vitamin D per day seems prudent. Certain women may benefit from higher intake of Vitamin D.  If you are among these women, your doctor will have told you during your visit.    PAP SMEARS:  Pap smears, to check for cervical cancer or precancers,  have traditionally been done yearly, although recent scientific advances have shown that most women can have pap smears less often.  However, every woman still should have a physical exam from her gynecologist every year. It will include a breast check, inspection of the vulva and vagina to check for  abnormal growths or skin changes, a visual exam of the cervix, and then an exam to evaluate the size and shape of the uterus and ovaries.  And after 66 years of age, a rectal exam is indicated to check for rectal cancers. We will also provide age appropriate advice regarding health maintenance, like when you should have certain vaccines, screening for sexually transmitted diseases, bone density testing, colonoscopy, mammograms, etc.   MAMMOGRAMS:  All women over 74 years old should have a yearly mammogram. Many facilities now offer a "3D" mammogram, which may cost around $50 extra out of pocket. If possible,  we recommend you accept the option to have the 3D mammogram performed.  It both reduces the number of women who will be called back for extra views which then turn  out to be normal, and it is better than the routine mammogram at detecting truly abnormal areas.    COLONOSCOPY:  Colonoscopy to screen for colon cancer is recommended for all women at age 84.  We know, you hate the idea of the prep.  We agree, BUT, having colon cancer and not knowing it is worse!!  Colon cancer so often starts as a polyp that can be seen and removed at colonscopy, which can quite literally save your life!  And if your first colonoscopy is normal and you have no family history of colon cancer, most women don't have to have it again for 10 years.  Once every ten years, you can do something that may end up saving your life, right?  We will be happy to help you get it scheduled when you are ready.  Be sure to check your insurance coverage so you understand how much it will cost.  It may be covered as a preventative service at no cost, but you should check your particular policy.

## 2014-09-05 ENCOUNTER — Encounter: Payer: Self-pay | Admitting: Obstetrics and Gynecology

## 2016-08-22 ENCOUNTER — Telehealth: Payer: Self-pay | Admitting: Obstetrics and Gynecology

## 2016-08-22 NOTE — Telephone Encounter (Signed)
Patient says she has a protrusion coming out of her vagina. Wants to speak with a nurse.

## 2016-08-22 NOTE — Telephone Encounter (Signed)
I agree with your recommendations.  You may close the encounter.  

## 2016-08-22 NOTE — Telephone Encounter (Signed)
Spoke with patient. Patient state that she has intermittently noticed a protrusion from her vaginal opening. Reports this feels like a small balloon. Reports this occurred last night and into this morning. "I have not been able to associate it with anything in particular." Denies any pain. Reports she is able to empty her bladder without difficulty. Advised patient this is likely related to a bladder prolapse which is not uncommon.  Advised she will need to be seen in the office for further assessment with Dr.Silva. Patient is agreeable and verbalizes understanding. Declines appointment this week. Requesting an appointment on a Tuesday or Wednesday. Appointment scheduled for 08/28/2016 at 1 pm with Dr.Silva. Patient is agreeable to date and time. Advised if she develops any new symptoms, this starts to occur more frequently, or has difficulty emptying her bladder she will need to be seen for earlier evaluation. Patient is agreeable.  Routing to Dr.Silva for review.

## 2016-08-28 ENCOUNTER — Encounter: Payer: Self-pay | Admitting: Obstetrics and Gynecology

## 2016-08-28 ENCOUNTER — Ambulatory Visit (INDEPENDENT_AMBULATORY_CARE_PROVIDER_SITE_OTHER): Payer: Medicare Other | Admitting: Obstetrics and Gynecology

## 2016-08-28 VITALS — BP 138/70 | HR 92 | Resp 16 | Ht 63.5 in | Wt 187.0 lb

## 2016-08-28 DIAGNOSIS — N812 Incomplete uterovaginal prolapse: Secondary | ICD-10-CM | POA: Diagnosis not present

## 2016-08-28 DIAGNOSIS — N393 Stress incontinence (female) (male): Secondary | ICD-10-CM

## 2016-08-28 NOTE — Progress Notes (Signed)
GYNECOLOGY  VISIT   HPI: 70 y.o.   Widowed  Caucasian  female   [id number] with Patient's last menstrual period was 11/05/2011 (approximate).   here for vaginal prolapse.   Symptoms for 3 to 4 weeks.  Leaking urine with sneezing.  Voiding often.  DF - every  1 - 1.5 hours.  Depnds on water intake. NF - none. Using a pad for protection. No leak for no reason at all.  No urgency.  Does splinting in the vagina to support while she has a BM.  No leakage of stool.  Sexually active and had pressure with intercourse.   No UTIs currently.  Last one was 3 - 4 year ago.  No pyelonephritis.  No kidney stones.  No hematuria.  No vaginal bleeding.    PCP - Dr. Ulice Brilliant in [place].  GYNECOLOGIC HISTORY: Patient's last menstrual period was 11/05/2011 (approximate). Contraception: Tubal/Postmenopausal Menopausal hormone therapy:  None Last mammogram:  10-06-13 Density B/Neg/BiRads1:[facility] Last pap smear: 04-07-12 Neg:Neg HR HPV        OB History    Gravida Para Term Preterm AB Living   2 2       2    SAB TAB Ectopic Multiple Live Births                     There are no active problems to display for this patient.   Past Medical History:  Diagnosis Date  . Elevated cholesterol   . No pertinent past medical history   . PMB (postmenopausal bleeding) 03/10/2012    Past Surgical History:  Procedure Laterality Date  . cold knife conization  1975   abnormal pap  . ENDOMETRIAL BIOPSY     simple hyperplasia  . FOOT SURGERY    . HYSTEROSCOPY  03/10/12   resect, D&C secondary PMB  . LASIK    . TUBAL LIGATION      Current Outpatient Prescriptions  Medication Sig Dispense Refill  . Biotin 1 MG CAPS Take by mouth daily.    Marland Kitchen CALCIUM PO Take 600 mg by mouth 2 (two) times daily.    Marland Kitchen escitalopram (LEXAPRO) 10 MG tablet Take 1 tablet (10 mg total) by mouth daily. 90 tablet 0  . Multiple Vitamin (MULITIVITAMIN WITH MINERALS) TABS Take 1 tablet by mouth every morning.     No  current facility-administered medications for this visit.      ALLERGIES: Review of patient's allergies indicates no known allergies.  Family History  Problem Relation Age of Onset  . Hypertension Mother   . Heart failure Father     Social History   Social History  . Marital status: Widowed    Spouse name: N/A  . Number of children: N/A  . Years of education: N/A   Occupational History  . Not on file.   Social History Main Topics  . Smoking status: Never Smoker  . Smokeless tobacco: Never Used  . Alcohol use No     Comment: 2-3 glasses of wine a week  . Drug use: No  . Sexual activity: Yes    Partners: Male    Birth control/ protection: Post-menopausal, Surgical     Comment: BTSP   Other Topics Concern  . Not on file   Social History Narrative  . No narrative on file    ROS:  Pertinent items are noted in HPI.  PHYSICAL EXAMINATION:    BP 138/70 (BP Location: Left Arm, Patient Position: Sitting, Cuff Size: Large)  Pulse 92   Resp 16   Ht 5' 3.5" (1.613 m)   Wt 187 lb (84.8 kg)   LMP 11/05/2011 (Approximate)   BMI 32.61 kg/m     General appearance: alert, cooperative and appears stated age  Lungs: clear to auscultation bilaterally Heart: regular rate and rhythm Abdomen: soft, non-tender, no masses,  no organomegaly   Pelvic: External genitalia:  no lesions              Urethra:  normal appearing urethra with no masses, tenderness or lesions              Bartholins and Skenes: normal                 Vagina: normal appearing vagina with normal color and discharge, no lesions. Second degree prolapse of bladder, uterine prolapse is about first degree, minimal rectocele.  Patient examined in lithotomy and standing positions.              Cervix: no lesions                Bimanual Exam:  Uterus:  normal size, contour, position, consistency, mobility, non-tender              Adnexa: no mass, fullness, tenderness              Rectal exam: Yes.  .  Confirms.               Anus:  normal sphincter tone, no lesions  Chaperone was present for exam.  ASSESSMENT  Incomplete uterovaginal prolapse.  Genuine stress incontinence.    PLAN  Discussion of prolapse and incontinence - etiologies and tx options including observation, pelvic floor PT/pessary, and surgery.  If surgery were chosen, I would recommend an LAVH/BSO, culdoplasty, anterior and posterior colporrhaphy, and likely a TVT Exact midurethral sling/cystoscopy. She is interested in surgical care, so we will pursue urodynamic testing.  Procedure explained.  Return for well woman visit.  She will schedule her mammogram. ACOG handouts on prolapse and incontinence and surgical care of these conditions to patient.   An After Visit Summary was printed and given to the patient.  ___25___ minutes face to face time of which over 50% was spent in counseling.

## 2016-09-02 ENCOUNTER — Telehealth: Payer: Self-pay | Admitting: *Deleted

## 2016-09-02 NOTE — Telephone Encounter (Signed)
Call to patient to discuss surgery date options. Patient is planning for January date. Will plan urodynamics in December. Patient agreeable to this plan. Advised will confirm procedure date and call back with instructions.

## 2016-09-10 NOTE — Telephone Encounter (Signed)
Message left to return call to Sharon Hardin at [phone].    

## 2016-09-10 NOTE — Telephone Encounter (Signed)
Call to patient. Urodynamics reviewed with patient and she verbalized understanding. Patient aware this letter will be mailed to her.   Urodynamics Bladder Testing  Your physician has recommended a procedure for you to be done in our office called Urodynamics. Your Urodynamics testing appointment is scheduled for Wednesday 10/16/16 at 1100. Please arrive 15 minutes prior to your appointment for check in.   What is Urodynamics? Urodynamics is a test that looks at the behavior of the bladder. There are different types of urinary incontinence and they each require different treatments.  Although this test may be very personal in nature, we are attempting to recreate your urinary leakage during testing so that your physician can ensure you have the right treatment for your diagnosis.  Patient instructions: . You will need to come to our office 2-3 days prior for a urine test to ensure you do not have any urinary infection prior to the procedure.   . Your appointment for urine testing is scheduled for Thursday 10/10/16 at 0900.  Marland Kitchen Please stop any bladder control medications one week prior to the procedure. This includes: Ditropan, Detrol, Enablex, Oxytrol, Sanctura, and Vesicare. You may take all of your other medications as prescribed.  . Please arrive for your appointment with a comfortably full bladder, as you will need to void at the beginning of testing. . You may eat and drink normally on the day of your test.  You may shower normally on the day of your test.  Patient information: . The test will take approximately one hour to complete.  It is not painful and you will be with a nurse and assistant for the procedure. . You will be asked to empty your bladder in private, into a special toilet. . Small catheters (thin flexible tube) will be inserted into your bladder and rectum which will record pressure readings of your bladder into a computer.   . Your bladder will be filled with sterile water  until you feel the need to urinate.  . You will be directed to cough and bare down at various stages during the test.  . At the end of the test you will be allowed to empty your bladder.  After your testing is completed: Marland Kitchen You may have a mild bladder and rectal discomfort for a few hours after the test. . You may experience some frequent urination and slight burning the first few times you urinate after the test. Rarely, the urine may be blood tinged. These are both due to catheter placements and resolve quickly.  . You should call our office immediately if you have signs of infection, which may include bladder pain, urinary urgency, fever, or burning during urination. .  Most people tolerate this test very well and experience only minimal discomfort during placement of catheters.  .  We do encourage you to drink plenty of water after the test.  You will have an appointment approximately one week after testing to discuss your results of this testing with your physician.      This follow up appointment is scheduled for Wednesday 10/23/16 at 0930.   Please call our office if you have any concerns or need to reschedule this procedure. If you do not cancel your procedure 3 business days in advance, you will incur a $100.00 cancellation charge.  Thank you for allowing [place] to partner in your care and planning for your procedures.    Cc Lamont Snowball, RN

## 2016-10-10 ENCOUNTER — Ambulatory Visit (INDEPENDENT_AMBULATORY_CARE_PROVIDER_SITE_OTHER): Payer: Medicare Other

## 2016-10-10 VITALS — BP 140/80 | HR 72 | Resp 16 | Ht 63.5 in | Wt 191.6 lb

## 2016-10-10 DIAGNOSIS — Z01812 Encounter for preprocedural laboratory examination: Secondary | ICD-10-CM

## 2016-10-10 LAB — POCT URINALYSIS DIPSTICK
Bilirubin, UA: NEGATIVE
Blood, UA: NEGATIVE
Glucose, UA: NEGATIVE
KETONES UA: NEGATIVE
Leukocytes, UA: NEGATIVE
Nitrite, UA: NEGATIVE
PH UA: 7
PROTEIN UA: NEGATIVE
Urobilinogen, UA: NEGATIVE

## 2016-10-10 NOTE — Progress Notes (Signed)
Patient here for u/a for Urodynamics next week.   Patient is feeling well today.   Routed to provider and encounter closed.

## 2016-10-16 ENCOUNTER — Ambulatory Visit: Payer: Medicare Other

## 2016-10-16 ENCOUNTER — Ambulatory Visit (INDEPENDENT_AMBULATORY_CARE_PROVIDER_SITE_OTHER): Payer: Medicare Other

## 2016-10-16 VITALS — BP 146/80 | HR 84 | Resp 12 | Ht 64.0 in | Wt 191.4 lb

## 2016-10-16 DIAGNOSIS — N393 Stress incontinence (female) (male): Secondary | ICD-10-CM | POA: Diagnosis not present

## 2016-10-16 DIAGNOSIS — N812 Incomplete uterovaginal prolapse: Secondary | ICD-10-CM

## 2016-10-16 NOTE — Progress Notes (Signed)
Sharon Hardin is a 70 y.o. female Who presents today for urodynamics testings. Allergies and medications reviewed. Consent for procedure signed. Denies complaints today. No urinary complaints.  Urine Micro exam: negative for WBC's or RBC's, okay to proceed per Dr. Quincy Simmonds. Patient reports urinary leakage with coughing, sneezing and pushing.  Post void residual 50 ml.  Urodynamics testing initiated. Urethral catheter was placed by Karmen Bongo, RN.  Rectal catheter placed without issue.  Urodynamics testing completed. Please see scanned Patient summary report in Epic. Procedure completed and patient tolerated well without complaints.   Patient given post procedure instructions:  You may have a mild bladder and rectal discomfort for a few hours after the test. You may experience some frequent urination and slight burning the first few times you urinate after the test. Rarely, the urine may be blood tinged. These are both due to catheter placements and resolve quickly. You should call our office immediately if you have signs of infection, which may include bladder pain, urinary urgency, fever, or burning during urination. We do encourage you to drink plenty of water after the test.    Charges: [place]

## 2016-10-17 NOTE — Progress Notes (Signed)
70 y.o. G2P2 Widowed Caucasian female here for annual exam.    Considering surgery for prolapse and incontinence. Splints to have BMS.  Has second degree cystocele, first degree uterine prolapse, and minimal rectocele.  Urodynamics done 10/16/16 which confirmed stress incontinence.   Has osteoarthritis which causes hip pain.   PCP:   Dr. Ulice Brilliant  Patient's last menstrual period was 11/05/2011 (approximate).       Sexually active: Yes.    The current method of family planning is tubal ligation.    Exercising: No.  The patient does not participate in regular exercise at present. Smoker:  no  Health Maintenance: Pap:  04-07-12 neg History of abnormal Pap:  Yes in her 74s.  Had a conization. MMG:  10-06-13 category b density birads 1:neg Colonoscopy:  2005 f/u 60yrs BMD:   2011  Result  Osteopenia.   TDaP:  2015 HIV:  Will do with PCP.  Hep C:  Will do with PCP.  Screening Labs:  Hb today: PCP, Urine today: PCP   reports that she has never smoked. She has never used smokeless tobacco. She reports that she does not drink alcohol or use drugs.  Past Medical History:  Diagnosis Date  . Elevated cholesterol   . No pertinent past medical history   . PMB (postmenopausal bleeding) 03/10/2012    Past Surgical History:  Procedure Laterality Date  . cold knife conization  1975   abnormal pap  . ENDOMETRIAL BIOPSY     simple hyperplasia  . FOOT SURGERY    . HYSTEROSCOPY  03/10/12   resect, D&C secondary PMB  . LASIK    . TUBAL LIGATION      Current Outpatient Prescriptions  Medication Sig Dispense Refill  . Biotin 1 MG CAPS Take by mouth daily.    Marland Kitchen CALCIUM PO Take 600 mg by mouth 2 (two) times daily.    Marland Kitchen escitalopram (LEXAPRO) 10 MG tablet Take 1 tablet (10 mg total) by mouth daily. 90 tablet 0  . Multiple Vitamin (MULITIVITAMIN WITH MINERALS) TABS Take 1 tablet by mouth every morning.     No current facility-administered medications for this visit.     Family History   Problem Relation Age of Onset  . Hypertension Mother   . Heart failure Father     ROS:  Pertinent items are noted in HPI.  Otherwise, a comprehensive ROS was negative.  Exam:   BP (!) 142/70 (BP Location: Right Arm, Patient Position: Sitting, Cuff Size: Normal)   Pulse 84   Resp 16   Ht 5' 3.25" (1.607 m)   Wt 190 lb (86.2 kg)   LMP 11/05/2011 (Approximate)   BMI 33.39 kg/m     General appearance: alert, cooperative and appears stated age Head: Normocephalic, without obvious abnormality, atraumatic Neck: no adenopathy, supple, symmetrical, trachea midline and thyroid normal to inspection and palpation Lungs: clear to auscultation bilaterally Breasts: normal appearance, no masses or tenderness, No nipple retraction or dimpling, No nipple discharge or bleeding, No axillary or supraclavicular adenopathy Heart: regular rate and rhythm Abdomen: soft, non-tender; no masses, no organomegaly Extremities: extremities normal, atraumatic, no cyanosis or edema Skin: Skin color, texture, turgor normal. No rashes or lesions Lymph nodes: Cervical, supraclavicular, and axillary nodes normal. No abnormal inguinal nodes palpated Neurologic: Grossly normal  Pelvic: External genitalia:  no lesions              Urethra:  normal appearing urethra with no masses, tenderness or lesions  Bartholins and Skenes: normal                 Vagina: normal appearing vagina with normal color and discharge, no lesions.  Patient examined lying and standing.  Has almost third degree cystocele, first degree uterine prolapse and first degree rectocele.               Cervix: no lesions              Pap taken: Yes.   Bimanual Exam:  Uterus:  normal size, contour, position, consistency, mobility, non-tender              Adnexa: no mass, fullness, tenderness              Rectal exam: Yes.  .  Confirms.              Anus:  normal sphincter tone, no lesions  Chaperone was present for exam.  Assessment:    Well woman visit with normal exam. Incomplete uterovaginal prolapse.  Genuine stress incontinence.  Osteopenia.   Plan: Yearly mammogram recommended after age 33.  Needs to schedule mammogram and bone density.  Recommended self breast exam.  Pap and HR HPV as above. Guidelines for Calcium, Vitamin D, regular exercise program including cardiovascular and weight bearing exercise. Considering surgery for LAVH/BSO/Ant and post repair/TVT Exact and cysto in January 2018. Follow up annually and prn.   Additional counseling given.  Yes.  . __15____ minutes face to face time of which over 50% was spent in counseling regarding genuine stress incontinence and pelvic organ prolapse.   I discussed midurethral slings at the time of prolapse surgery.  We discussed benefits and risks including but not limited to mesh erosion and exposure, urinary retention, cystotomy, slower voiding and urinary urgency.    After visit summary provided.

## 2016-10-18 ENCOUNTER — Other Ambulatory Visit: Payer: Self-pay | Admitting: Obstetrics and Gynecology

## 2016-10-18 ENCOUNTER — Ambulatory Visit (INDEPENDENT_AMBULATORY_CARE_PROVIDER_SITE_OTHER): Payer: Medicare Other | Admitting: Obstetrics and Gynecology

## 2016-10-18 ENCOUNTER — Encounter: Payer: Self-pay | Admitting: Obstetrics and Gynecology

## 2016-10-18 VITALS — BP 142/70 | HR 84 | Resp 16 | Ht 63.25 in | Wt 190.0 lb

## 2016-10-18 DIAGNOSIS — Z780 Asymptomatic menopausal state: Secondary | ICD-10-CM | POA: Diagnosis not present

## 2016-10-18 DIAGNOSIS — N812 Incomplete uterovaginal prolapse: Secondary | ICD-10-CM | POA: Diagnosis not present

## 2016-10-18 DIAGNOSIS — Z01411 Encounter for gynecological examination (general) (routine) with abnormal findings: Secondary | ICD-10-CM | POA: Diagnosis not present

## 2016-10-18 DIAGNOSIS — Z1239 Encounter for other screening for malignant neoplasm of breast: Secondary | ICD-10-CM

## 2016-10-18 DIAGNOSIS — Z1231 Encounter for screening mammogram for malignant neoplasm of breast: Secondary | ICD-10-CM

## 2016-10-18 DIAGNOSIS — M8580 Other specified disorders of bone density and structure, unspecified site: Secondary | ICD-10-CM

## 2016-10-18 DIAGNOSIS — Z124 Encounter for screening for malignant neoplasm of cervix: Secondary | ICD-10-CM

## 2016-10-18 DIAGNOSIS — N393 Stress incontinence (female) (male): Secondary | ICD-10-CM

## 2016-10-18 NOTE — Patient Instructions (Signed)

## 2016-10-21 LAB — IPS PAP SMEAR ONLY

## 2016-10-23 ENCOUNTER — Ambulatory Visit: Payer: Self-pay | Admitting: Obstetrics and Gynecology

## 2016-11-03 NOTE — Progress Notes (Signed)
Multichannel urodynamic testing: Uroflow - void:  606 cc, PVR:  50 cc. Essentially continuous flow. CMG - S1 284 cc, S2 502 cc, S3 709 cc.  VLPP 51 cm H2O at 720 cc.  UPP - 33 cm H2O. Pressure flow - Pdet max 6 cmH2O.  Poor detrusor contraction.  Voids by straining.   GSI.  Poor detrusor contraction on pressure flow study but uroflow looked unremarkable.

## 2016-11-04 DIAGNOSIS — M85859 Other specified disorders of bone density and structure, unspecified thigh: Secondary | ICD-10-CM

## 2016-11-04 HISTORY — DX: Other specified disorders of bone density and structure, unspecified thigh: M85.859

## 2016-11-05 ENCOUNTER — Telehealth: Payer: Self-pay | Admitting: *Deleted

## 2016-11-05 NOTE — Telephone Encounter (Signed)
Call to patient to discuss surgery date options. Patient requests November 26, 2016. Will proceed with scheduling and call patient back.

## 2016-11-06 NOTE — Telephone Encounter (Signed)
Call to patient. Confirmed surgery scheduled for 11-26-16 at 0730 at [facility]. Patient advised to arrive at 0600. Surgery instruction sheet reviewed and printed copy will be provided at surgery consult on 11-11-16 with Dr Quincy Simmonds.   Routing to provider for final review. Patient agreeable to disposition. Will close encounter.

## 2016-11-07 ENCOUNTER — Encounter: Payer: Self-pay | Admitting: Obstetrics and Gynecology

## 2016-11-08 NOTE — Progress Notes (Signed)
GYNECOLOGY  VISIT   HPI: 71 y.o.   Widowed  Caucasian  female   G22P2 with Patient's last menstrual period was 11/05/2011 (approximate).   here for surgical consult.  Wants surgery for prolapse and incontinence. Leaks with sneezing.  No leak for no reason at all.  Splints to have BMS.  Sexually active. Has second to third degree cystocele, first degree uterine prolapse, and minimal rectocele.  Urodynamics done 10/16/16 which confirmed stress incontinence.   States she has had minimally elevated blood pressure that has not been treated.   PCP - Dr. Ulice Brilliant, office in [place], [place].  GYNECOLOGIC HISTORY: Patient's last menstrual period was 11/05/2011 (approximate). Contraception:  Tubal/Postmenopausal Menopausal hormone therapy:  none Last mammogram:  10-06-13 Density B/Neg/BiRads1:[facility]--HAS APPT. 11-20-16 at [facility] Last pap smear: 10-18-16 Negative          OB History    Gravida Para Term Preterm AB Living   2 2       2    SAB TAB Ectopic Multiple Live Births                     There are no active problems to display for this patient.   Past Medical History:  Diagnosis Date  . Elevated cholesterol   . No pertinent past medical history   . PMB (postmenopausal bleeding) 03/10/2012    Past Surgical History:  Procedure Laterality Date  . cold knife conization  1975   abnormal pap  . ENDOMETRIAL BIOPSY     simple hyperplasia  . FOOT SURGERY    . HYSTEROSCOPY  03/10/12   resect, D&C secondary PMB  . LASIK    . TUBAL LIGATION      Current Outpatient Prescriptions  Medication Sig Dispense Refill  . Biotin 1 MG CAPS Take by mouth daily.    Marland Kitchen CALCIUM PO Take 600 mg by mouth 2 (two) times daily.    Marland Kitchen escitalopram (LEXAPRO) 10 MG tablet Take 1 tablet (10 mg total) by mouth daily. 90 tablet 0  . Multiple Vitamin (MULITIVITAMIN WITH MINERALS) TABS Take 1 tablet by mouth every morning.     No current facility-administered medications for this visit.       ALLERGIES: Patient has no known allergies.  Family History  Problem Relation Age of Onset  . Hypertension Mother   . Heart failure Father     Social History   Social History  . Marital status: Widowed    Spouse name: N/A  . Number of children: N/A  . Years of education: N/A   Occupational History  . Not on file.   Social History Main Topics  . Smoking status: Never Smoker  . Smokeless tobacco: Never Used  . Alcohol use No     Comment: 2-3 glasses of wine a week  . Drug use: No  . Sexual activity: Yes    Partners: Male    Birth control/ protection: Post-menopausal, Surgical     Comment: BTSP   Other Topics Concern  . Not on file   Social History Narrative  . No narrative on file    ROS:  Pertinent items are noted in HPI.  PHYSICAL EXAMINATION:    BP (!) 150/72 (BP Location: Right Arm, Patient Position: Sitting, Cuff Size: Normal)   Pulse 70   Ht 5' 3.5" (1.613 m)   Wt 188 lb 6.4 oz (85.5 kg)   LMP 11/05/2011 (Approximate)   BMI 32.85 kg/m     General appearance:  alert, cooperative and appears stated age Head: Normocephalic, without obvious abnormality, atraumatic Neck: no adenopathy, supple, symmetrical, trachea midline and thyroid normal to inspection and palpation Lungs: clear to auscultation bilaterally Heart: regular rate and rhythm Abdomen: soft, non-tender, no masses,  no organomegaly Extremities: extremities normal, atraumatic, no cyanosis or edema Skin: Skin color, texture, turgor normal. No rashes or lesions Lymph nodes: Cervical, supraclavicular, and axillary nodes normal. No abnormal inguinal nodes palpated Neurologic: Grossly normal  Pelvic: External genitalia:  no lesions              Urethra:  normal appearing urethra with no masses, tenderness or lesions              Bartholins and Skenes: normal                 Vagina: normal appearing vagina with normal color and discharge, no lesions.  Has first degree cystocele, first degree uterine  prolapse and minimal degree rectocele. (More prolapse with standing position.)              Cervix: no lesions                Bimanual Exam:  Uterus:  normal size, contour, position, consistency, mobility, non-tender              Adnexa: no mass, fullness, tenderness              Rectal exam: Yes.  .  Confirms.              Anus:  normal sphincter tone, no lesions  Chaperone was present for exam.  ASSESSMENT  Incomplete uterovaginal prolapse.  Genuine stress incontinence.  Hx cervical conization.  Normal recent pap.  Elevated blood pressure today and at previous visits on chart review.  PLAN  LAVH/BSO/Ant and post repair/TVT Exact and cystoscopy.  Procedures explained. Risks, benefits, alternatives reviewed with patient who wishes to proceed.  We discussed risks of surgery which include but are not limited to bleeding, infection, damage to surrounding organs, permanent mesh use which may cause erosion and exposure in the vagina, urethra, bladder or ureters, dyspareunia, slower voiding and urinary retention, possible need for prolonged catheterization and/or self catheterization, de novo overactive bladder symptoms, reoperation, recurrence of prolapse and incontinence,  DVT, PE, death, and reaction to anesthesia.   She will do an enema the night before surgery. Surgical expectations and recovery reviewed.  She understands the 3 month period post op of no lifting over 10 pounds and no sexual activity during this time.  Will have patient see PCP regarding elevated blood pressure.   An After Visit Summary was printed and given to the patient.  _25_____ minutes face to face time of which over 50% was spent in counseling.

## 2016-11-11 ENCOUNTER — Encounter: Payer: Self-pay | Admitting: Obstetrics and Gynecology

## 2016-11-11 ENCOUNTER — Ambulatory Visit (INDEPENDENT_AMBULATORY_CARE_PROVIDER_SITE_OTHER): Payer: Medicare Other | Admitting: Obstetrics and Gynecology

## 2016-11-11 VITALS — BP 150/72 | HR 70 | Ht 63.5 in | Wt 188.4 lb

## 2016-11-11 DIAGNOSIS — N393 Stress incontinence (female) (male): Secondary | ICD-10-CM | POA: Diagnosis not present

## 2016-11-11 DIAGNOSIS — N812 Incomplete uterovaginal prolapse: Secondary | ICD-10-CM | POA: Diagnosis not present

## 2016-11-11 DIAGNOSIS — R030 Elevated blood-pressure reading, without diagnosis of hypertension: Secondary | ICD-10-CM | POA: Diagnosis not present

## 2016-11-11 NOTE — Progress Notes (Signed)
Per Dr Elza Rafter instruction, call to PCP office, Dr Claudia Pollock, appointment scheduled with PA this afternoon at 130pm for BP management prior to surgery. Patient agreeable to appointment time. Patient has office contact information to provide to PCP so they may provide update.

## 2016-11-14 NOTE — Patient Instructions (Signed)
Your procedure is scheduled on:  Tuesday, Jan. 23, 2018  Enter through the [employer] [facility] at:  6:00 AM  Pick up the phone at the desk and dial [phone].  Call this number if you have problems the morning of surgery: [phone].  Remember: Do NOT eat food or drink after:  Midnight Monday, Jan. 22, 2018  Take these medicines the morning of surgery with a SIP OF WATER:  Escitalopram, Lisinopril  Stop ALL herbal medications at this time  Do NOT smoke the day of surgery.  Do NOT wear jewelry (body piercing), metal hair clips/bobby pins, make-up, or nail polish. Do NOT wear lotions, powders, or perfumes.  You may wear deodorant. Do NOT shave for 48 hours prior to surgery. Do NOT bring valuables to the hospital. Contacts, dentures, or bridgework may not be worn into surgery.  Leave suitcase in car.  After surgery it may be brought to your room.  For patients admitted to the hospital, checkout time is 11:00 AM the day of discharge.  Bring a copy of your healthcare power of attorney and living will documents.

## 2016-11-15 ENCOUNTER — Other Ambulatory Visit: Payer: Self-pay

## 2016-11-15 ENCOUNTER — Encounter (HOSPITAL_COMMUNITY): Payer: Self-pay

## 2016-11-15 ENCOUNTER — Encounter (HOSPITAL_COMMUNITY)
Admission: RE | Admit: 2016-11-15 | Discharge: 2016-11-15 | Disposition: A | Discharge status: Home / Self Care | Payer: Medicare Other | Source: Ambulatory Visit | Attending: Obstetrics and Gynecology | Admitting: Obstetrics and Gynecology

## 2016-11-15 DIAGNOSIS — Z01818 Encounter for other preprocedural examination: Secondary | ICD-10-CM | POA: Diagnosis not present

## 2016-11-15 HISTORY — DX: Anxiety disorder, unspecified: F41.9

## 2016-11-15 HISTORY — DX: Unspecified osteoarthritis, unspecified site: M19.90

## 2016-11-15 HISTORY — DX: Essential (primary) hypertension: I10

## 2016-11-15 HISTORY — DX: Depression, unspecified: F32.A

## 2016-11-15 HISTORY — DX: Major depressive disorder, single episode, unspecified: F32.9

## 2016-11-15 LAB — CBC
HCT: 39.6 % (ref 36.0–46.0)
HEMOGLOBIN: 13.1 g/dL (ref 12.0–15.0)
MCH: 30.1 pg (ref 26.0–34.0)
MCHC: 33.1 g/dL (ref 30.0–36.0)
MCV: 91 fL (ref 78.0–100.0)
Platelets: 439 10*3/uL — ABNORMAL HIGH (ref 150–400)
RBC: 4.35 MIL/uL (ref 3.87–5.11)
RDW: 13.2 % (ref 11.5–15.5)
WBC: 8 10*3/uL (ref 4.0–10.5)

## 2016-11-15 LAB — BASIC METABOLIC PANEL
ANION GAP: 5 (ref 5–15)
BUN: 12 mg/dL (ref 6–20)
CALCIUM: 9 mg/dL (ref 8.9–10.3)
CO2: 26 mmol/L (ref 22–32)
Chloride: 104 mmol/L (ref 101–111)
Creatinine, Ser: 0.61 mg/dL (ref 0.44–1.00)
GLUCOSE: 101 mg/dL — AB (ref 65–99)
Potassium: 4.3 mmol/L (ref 3.5–5.1)
SODIUM: 135 mmol/L (ref 135–145)

## 2016-11-17 ENCOUNTER — Encounter: Payer: Self-pay | Admitting: Obstetrics and Gynecology

## 2016-11-17 DIAGNOSIS — I10 Essential (primary) hypertension: Secondary | ICD-10-CM | POA: Insufficient documentation

## 2016-11-18 ENCOUNTER — Telehealth: Payer: Self-pay | Admitting: *Deleted

## 2016-11-18 NOTE — Telephone Encounter (Signed)
-----   Message from Nunzio Cobbs, MD sent at 11/17/2016  5:58 PM EST ----- Please let patient know her preop lab are back.  Her glucose was one point above normal, and her platelets are mildly elevated.  Neither of these will affect her surgery.

## 2016-11-18 NOTE — Telephone Encounter (Signed)
Spoke with patient. Patient states she is at work right now, will return call this afternoon to review.

## 2016-11-20 ENCOUNTER — Ambulatory Visit: Payer: PRIVATE HEALTH INSURANCE

## 2016-11-20 ENCOUNTER — Other Ambulatory Visit: Payer: PRIVATE HEALTH INSURANCE

## 2016-11-25 MED ORDER — DEXTROSE 5 % IV SOLN
2.0000 g | INTRAVENOUS | Status: AC
  Administered 2016-11-26: 2 g via INTRAVENOUS
  Filled 2016-11-25: qty 2

## 2016-11-25 NOTE — H&P (Signed)
Office Visit   11/11/2016 [place], MD  Obstetrics and Gynecology   Uterovaginal prolapse, incomplete +2 more  Dx   Surgical Consult ; Referred by Charlott Rakes, MD  Reason for Visit   Additional Documentation   Vitals:   BP  150/72 (BP Location: Right Arm, Patient Position: Sitting, Cuff Size: Normal)   Pulse 70   Ht 5' 3.5" (1.613 m)   Wt 188 lb 6.4 oz (85.5 kg)   LMP 11/05/2011 (Approximate)   BMI 32.85 kg/m   BSA 1.96 m      More Vitals   Flowsheets:   Custom Formula Data,   MEWS Score,   Anthropometrics,   Infectious Disease Screening     Encounter Info:   Billing Info,   History,   Allergies,   Detailed Report     All Notes   Progress Notes by Huey Romans, RN at 11/11/2016 9:30 AM   Author: Huey Romans, RN Author Type: Registered Nurse Filed: 11/11/2016 4:44 PM  Note Status: Signed Cosign: Cosign Not Required Encounter Date: 11/11/2016 9:30 AM  Editor: Huey Romans, RN (Registered Nurse)    Per Dr Elza Rafter instruction, call to PCP office, Dr Claudia Pollock, appointment scheduled with PA this afternoon at 130pm for BP management prior to surgery. Patient agreeable to appointment time. Patient has office contact information to provide to PCP so they may provide update.      Progress Notes by Nunzio Cobbs, MD at 11/11/2016 9:30 AM   Author: Nunzio Cobbs, MD Author Type: Physician Filed: 11/11/2016 4:44 PM  Note Status: Signed Cosign: Cosign Not Required Encounter Date: 11/11/2016 9:30 AM  Editor: Nunzio Cobbs, MD (Physician)  Prior Versions: 1. Lowella Fairy, CMA (Certified Medical Assistant) at 11/11/2016 9:43 AM - Sign at close encounter    GYNECOLOGY  VISIT   HPI: 71 y.o.   Widowed  Caucasian  female   G5P2 with Patient's last menstrual period was 11/05/2011 (approximate).   here for surgical consult.  Wants surgery for prolapse and incontinence. Leaks with  sneezing.  No leak for no reason at all.  Splints to have BMS.  Sexually active. Has second to third degree cystocele, first degree uterine prolapse, and minimal rectocele.  Urodynamics done 10/16/16 which confirmed stress incontinence.   States she has had minimally elevated blood pressure that has not been treated.   PCP - Dr. Ulice Brilliant, office in [place], [place].  GYNECOLOGIC HISTORY: Patient's last menstrual period was 11/05/2011 (approximate). Contraception:  Tubal/Postmenopausal Menopausal hormone therapy:  none Last mammogram:  10-06-13 Density B/Neg/BiRads1:[facility]--HAS APPT. 11-20-16 at [facility] Last pap smear: 10-18-16 Negative                  OB History    Gravida Para Term Preterm AB Living   2 2       2    SAB TAB Ectopic Multiple Live Births                     There are no active problems to display for this patient.       Past Medical History:  Diagnosis Date  . Elevated cholesterol   . No pertinent past medical history   . PMB (postmenopausal bleeding) 03/10/2012         Past Surgical History:  Procedure Laterality Date  . cold knife conization  1975  abnormal pap  . ENDOMETRIAL BIOPSY     simple hyperplasia  . FOOT SURGERY    . HYSTEROSCOPY  03/10/12   resect, D&C secondary PMB  . LASIK    . TUBAL LIGATION            Current Outpatient Prescriptions  Medication Sig Dispense Refill  . Biotin 1 MG CAPS Take by mouth daily.    Marland Kitchen CALCIUM PO Take 600 mg by mouth 2 (two) times daily.    Marland Kitchen escitalopram (LEXAPRO) 10 MG tablet Take 1 tablet (10 mg total) by mouth daily. 90 tablet 0  . Multiple Vitamin (MULITIVITAMIN WITH MINERALS) TABS Take 1 tablet by mouth every morning.     No current facility-administered medications for this visit.      ALLERGIES: Patient has no known allergies.       Family History  Problem Relation Age of Onset  . Hypertension Mother   . Heart failure Father     Social  History        Social History  . Marital status: Widowed    Spouse name: N/A  . Number of children: N/A  . Years of education: N/A      Occupational History  . Not on file.         Social History Main Topics  . Smoking status: Never Smoker  . Smokeless tobacco: Never Used  . Alcohol use No     Comment: 2-3 glasses of wine a week  . Drug use: No  . Sexual activity: Yes    Partners: Male    Birth control/ protection: Post-menopausal, Surgical     Comment: BTSP       Other Topics Concern  . Not on file      Social History Narrative  . No narrative on file    ROS:  Pertinent items are noted in HPI.  PHYSICAL EXAMINATION:    BP (!) 150/72 (BP Location: Right Arm, Patient Position: Sitting, Cuff Size: Normal)   Pulse 70   Ht 5' 3.5" (1.613 m)   Wt 188 lb 6.4 oz (85.5 kg)   LMP 11/05/2011 (Approximate)   BMI 32.85 kg/m     General appearance: alert, cooperative and appears stated age Head: Normocephalic, without obvious abnormality, atraumatic Neck: no adenopathy, supple, symmetrical, trachea midline and thyroid normal to inspection and palpation Lungs: clear to auscultation bilaterally Heart: regular rate and rhythm Abdomen: soft, non-tender, no masses,  no organomegaly Extremities: extremities normal, atraumatic, no cyanosis or edema Skin: Skin color, texture, turgor normal. No rashes or lesions Lymph nodes: Cervical, supraclavicular, and axillary nodes normal. No abnormal inguinal nodes palpated Neurologic: Grossly normal  Pelvic: External genitalia:  no lesions              Urethra:  normal appearing urethra with no masses, tenderness or lesions              Bartholins and Skenes: normal                 Vagina: normal appearing vagina with normal color and discharge, no lesions.  Has first degree cystocele, first degree uterine prolapse and minimal degree rectocele. (More prolapse with standing position.)              Cervix: no  lesions                Bimanual Exam:  Uterus:  normal size, contour, position, consistency, mobility, non-tender  Adnexa: no mass, fullness, tenderness              Rectal exam: Yes.  .  Confirms.              Anus:  normal sphincter tone, no lesions  Chaperone was present for exam.  ASSESSMENT  Incomplete uterovaginal prolapse.  Genuine stress incontinence.  Hx cervical conization.  Normal recent pap.  Elevated blood pressure today and at previous visits on chart review.  PLAN  LAVH/BSO/Ant and post repair/TVT Exact and cystoscopy.  Procedures explained. Risks, benefits, alternatives reviewed with patient who wishes to proceed.  We discussed risks of surgery which include but are not limited to bleeding, infection, damage to surrounding organs, permanent mesh use which may cause erosion and exposure in the vagina, urethra, bladder or ureters, dyspareunia, slower voiding and urinary retention, possible need for prolonged catheterization and/or self catheterization, de novo overactive bladder symptoms, reoperation, recurrence of prolapse and incontinence,  DVT, PE, death, and reaction to anesthesia.   She will do an enema the night before surgery. Surgical expectations and recovery reviewed.  She understands the 3 month period post op of no lifting over 10 pounds and no sexual activity during this time.  Will have patient see PCP regarding elevated blood pressure.   An After Visit Summary was printed and given to the patient.  _25_____ minutes face to face time of which over 50% was spent in counseling.

## 2016-11-25 NOTE — Anesthesia Preprocedure Evaluation (Signed)
Anesthesia Evaluation  Patient identified by MRN, date of birth, ID band Patient awake    Reviewed: Allergy & Precautions, H&P , Patient's Chart, lab work & pertinent test results, reviewed documented beta blocker date and time   Airway Mallampati: II  TM Distance: >3 FB Neck ROM: full    Dental no notable dental hx.    Pulmonary    Pulmonary exam normal breath sounds clear to auscultation       Cardiovascular hypertension, On Medications  Rhythm:regular Rate:Normal     Neuro/Psych    GI/Hepatic   Endo/Other    Renal/GU      Musculoskeletal   Abdominal   Peds  Hematology   Anesthesia Other Findings NSR  Reproductive/Obstetrics                             Anesthesia Physical Anesthesia Plan  ASA: II  Anesthesia Plan: General   Post-op Pain Management:    Induction: Intravenous  Airway Management Planned: Oral ETT  Additional Equipment:   Intra-op Plan:   Post-operative Plan: Extubation in OR  Informed Consent: I have reviewed the patients History and Physical, chart, labs and discussed the procedure including the risks, benefits and alternatives for the proposed anesthesia with the patient or authorized representative who has indicated his/her understanding and acceptance.   Dental Advisory Given and Dental advisory given  Plan Discussed with: CRNA and Surgeon  Anesthesia Plan Comments: (  Discussed general anesthesia, including possible nausea, instrumentation of airway, sore throat,pulmonary aspiration, etc. I asked if the were any outstanding questions, or  concerns before we proceeded.)        Anesthesia Quick Evaluation

## 2016-11-25 NOTE — Telephone Encounter (Signed)
Spoke with patient, advised of results and recommendations as seen below per Dr. Silva. Patient verbalizes understanding and is agreeable.  Routing to provider for final review. Patient is agreeable to disposition. Will close encounter.  

## 2016-11-26 ENCOUNTER — Ambulatory Visit (HOSPITAL_COMMUNITY): Payer: Medicare Other | Admitting: Anesthesiology

## 2016-11-26 ENCOUNTER — Encounter (HOSPITAL_COMMUNITY)
Admission: RE | Disposition: A | Discharge status: Home / Self Care | Payer: Self-pay | Source: Ambulatory Visit | Attending: Obstetrics and Gynecology

## 2016-11-26 ENCOUNTER — Encounter (HOSPITAL_COMMUNITY): Payer: Self-pay

## 2016-11-26 ENCOUNTER — Observation Stay (HOSPITAL_COMMUNITY)
Admission: RE | Admit: 2016-11-26 | Discharge: 2016-11-27 | Disposition: A | Discharge status: Home / Self Care | Payer: Medicare Other | Source: Ambulatory Visit | Attending: Obstetrics and Gynecology | Admitting: Obstetrics and Gynecology

## 2016-11-26 DIAGNOSIS — N812 Incomplete uterovaginal prolapse: Principal | ICD-10-CM | POA: Insufficient documentation

## 2016-11-26 DIAGNOSIS — D72829 Elevated white blood cell count, unspecified: Secondary | ICD-10-CM | POA: Insufficient documentation

## 2016-11-26 DIAGNOSIS — K660 Peritoneal adhesions (postprocedural) (postinfection): Secondary | ICD-10-CM | POA: Diagnosis not present

## 2016-11-26 DIAGNOSIS — N393 Stress incontinence (female) (male): Secondary | ICD-10-CM | POA: Diagnosis not present

## 2016-11-26 DIAGNOSIS — N736 Female pelvic peritoneal adhesions (postinfective): Secondary | ICD-10-CM | POA: Diagnosis not present

## 2016-11-26 DIAGNOSIS — Z79899 Other long term (current) drug therapy: Secondary | ICD-10-CM | POA: Diagnosis not present

## 2016-11-26 DIAGNOSIS — Z90710 Acquired absence of both cervix and uterus: Secondary | ICD-10-CM | POA: Diagnosis present

## 2016-11-26 DIAGNOSIS — I10 Essential (primary) hypertension: Secondary | ICD-10-CM | POA: Insufficient documentation

## 2016-11-26 HISTORY — PX: CYSTOSCOPY: SHX5120

## 2016-11-26 HISTORY — PX: ANTERIOR AND POSTERIOR REPAIR: SHX5121

## 2016-11-26 HISTORY — PX: LAPAROSCOPIC LYSIS OF ADHESIONS: SHX5905

## 2016-11-26 HISTORY — PX: BLADDER SUSPENSION: SHX72

## 2016-11-26 HISTORY — PX: LAPAROSCOPIC VAGINAL HYSTERECTOMY WITH SALPINGO OOPHORECTOMY: SHX6681

## 2016-11-26 SURGERY — HYSTERECTOMY, VAGINAL, LAPAROSCOPY-ASSISTED, WITH SALPINGO-OOPHORECTOMY
Anesthesia: General | Site: Vagina

## 2016-11-26 MED ORDER — PROPOFOL 10 MG/ML IV BOLUS
INTRAVENOUS | Status: DC | PRN
  Administered 2016-11-26: 160 mg via INTRAVENOUS

## 2016-11-26 MED ORDER — DIPHENHYDRAMINE HCL 12.5 MG/5ML PO ELIX: 12.5000 mg | ORAL_SOLUTION | Freq: Four times a day (QID) | ORAL | Status: DC | PRN

## 2016-11-26 MED ORDER — FENTANYL CITRATE (PF) 250 MCG/5ML IJ SOLN
INTRAMUSCULAR | Status: AC
  Filled 2016-11-26: qty 5

## 2016-11-26 MED ORDER — HYDROMORPHONE HCL 1 MG/ML IJ SOLN
INTRAMUSCULAR | Status: AC
  Filled 2016-11-26: qty 1

## 2016-11-26 MED ORDER — MIDAZOLAM HCL 2 MG/2ML IJ SOLN
INTRAMUSCULAR | Status: DC | PRN
  Administered 2016-11-26 (×2): 1 mg via INTRAVENOUS

## 2016-11-26 MED ORDER — ADULT MULTIVITAMIN W/MINERALS CH
1.0000 | ORAL_TABLET | Freq: Every day | ORAL | Status: DC
  Administered 2016-11-26 – 2016-11-27 (×2): 1 via ORAL
  Filled 2016-11-26 (×3): qty 1

## 2016-11-26 MED ORDER — MIDAZOLAM HCL 2 MG/2ML IJ SOLN
INTRAMUSCULAR | Status: AC
  Filled 2016-11-26: qty 2

## 2016-11-26 MED ORDER — ATROPINE SULFATE 0.4 MG/ML IJ SOLN
INTRAMUSCULAR | Status: DC | PRN
  Administered 2016-11-26: .2 mg via INTRAVENOUS
  Administered 2016-11-26: 0.2 mg via INTRAVENOUS

## 2016-11-26 MED ORDER — BUPIVACAINE HCL (PF) 0.25 % IJ SOLN
INTRAMUSCULAR | Status: DC | PRN
  Administered 2016-11-26: 4 mL

## 2016-11-26 MED ORDER — LISINOPRIL 10 MG PO TABS
10.0000 mg | ORAL_TABLET | Freq: Every day | ORAL | Status: DC
  Filled 2016-11-26 (×2): qty 1

## 2016-11-26 MED ORDER — SODIUM CHLORIDE 0.9% FLUSH: 9.0000 mL | INTRAVENOUS | Status: DC | PRN

## 2016-11-26 MED ORDER — ATROPINE SULFATE 0.4 MG/ML IJ SOLN
INTRAMUSCULAR | Status: AC
  Filled 2016-11-26: qty 1

## 2016-11-26 MED ORDER — HYDROMORPHONE HCL 1 MG/ML IJ SOLN
INTRAMUSCULAR | Status: DC | PRN
  Administered 2016-11-26: 1 mg via INTRAVENOUS

## 2016-11-26 MED ORDER — NALOXONE HCL 0.4 MG/ML IJ SOLN: 0.4000 mg | INTRAMUSCULAR | Status: DC | PRN

## 2016-11-26 MED ORDER — LIDOCAINE-EPINEPHRINE 1 %-1:100000 IJ SOLN
INTRAMUSCULAR | Status: AC
  Filled 2016-11-26: qty 1

## 2016-11-26 MED ORDER — ESCITALOPRAM OXALATE 10 MG PO TABS
10.0000 mg | ORAL_TABLET | Freq: Every day | ORAL | Status: DC
  Administered 2016-11-27: 10 mg via ORAL
  Filled 2016-11-26 (×2): qty 1

## 2016-11-26 MED ORDER — SUGAMMADEX SODIUM 200 MG/2ML IV SOLN
INTRAVENOUS | Status: DC | PRN
  Administered 2016-11-26: 200 mg via INTRAVENOUS

## 2016-11-26 MED ORDER — LIDOCAINE-EPINEPHRINE 1 %-1:100000 IJ SOLN
INTRAMUSCULAR | Status: DC | PRN
  Administered 2016-11-26: 22 mL

## 2016-11-26 MED ORDER — OXYCODONE-ACETAMINOPHEN 5-325 MG PO TABS
1.0000 | ORAL_TABLET | ORAL | Status: DC | PRN
  Administered 2016-11-27 (×3): 1 via ORAL
  Filled 2016-11-26 (×3): qty 1

## 2016-11-26 MED ORDER — ACETAMINOPHEN 160 MG/5ML PO SOLN
650.0000 mg | Freq: Once | ORAL | Status: AC
  Administered 2016-11-26: 650 mg via ORAL

## 2016-11-26 MED ORDER — BUPIVACAINE HCL (PF) 0.25 % IJ SOLN
INTRAMUSCULAR | Status: AC
  Filled 2016-11-26: qty 30

## 2016-11-26 MED ORDER — DEXAMETHASONE SODIUM PHOSPHATE 4 MG/ML IJ SOLN
INTRAMUSCULAR | Status: DC | PRN
  Administered 2016-11-26: 4 mg via INTRAVENOUS

## 2016-11-26 MED ORDER — FENTANYL CITRATE (PF) 100 MCG/2ML IJ SOLN
INTRAMUSCULAR | Status: DC | PRN
  Administered 2016-11-26 (×2): 50 ug via INTRAVENOUS
  Administered 2016-11-26: 100 ug via INTRAVENOUS
  Administered 2016-11-26 (×3): 50 ug via INTRAVENOUS

## 2016-11-26 MED ORDER — VITAMIN D3 25 MCG (1000 UNIT) PO TABS
2000.0000 [IU] | ORAL_TABLET | Freq: Every day | ORAL | Status: DC
  Administered 2016-11-26 – 2016-11-27 (×2): 2000 [IU] via ORAL
  Filled 2016-11-26 (×3): qty 2

## 2016-11-26 MED ORDER — MENTHOL 3 MG MT LOZG: 1.0000 | LOZENGE | OROMUCOSAL | Status: DC | PRN

## 2016-11-26 MED ORDER — LACTATED RINGERS IR SOLN
Status: DC | PRN
  Administered 2016-11-26: 3000 mL

## 2016-11-26 MED ORDER — BIOTIN 2500 MCG PO CAPS: 2500.0000 ug | ORAL_CAPSULE | Freq: Every day | ORAL | Status: DC

## 2016-11-26 MED ORDER — FENTANYL CITRATE (PF) 100 MCG/2ML IJ SOLN
25.0000 ug | INTRAMUSCULAR | Status: DC | PRN
  Administered 2016-11-26 (×4): 25 ug via INTRAVENOUS

## 2016-11-26 MED ORDER — SODIUM CHLORIDE 0.9 % IJ SOLN
INTRAMUSCULAR | Status: AC
  Filled 2016-11-26: qty 10

## 2016-11-26 MED ORDER — LACTATED RINGERS IV SOLN
INTRAVENOUS | Status: DC
  Administered 2016-11-26: 08:00:00 via INTRAVENOUS
  Administered 2016-11-26: 125 mL/h via INTRAVENOUS

## 2016-11-26 MED ORDER — PROPOFOL 10 MG/ML IV BOLUS
INTRAVENOUS | Status: AC
  Filled 2016-11-26: qty 20

## 2016-11-26 MED ORDER — LACTATED RINGERS IV SOLN
INTRAVENOUS | Status: DC
  Administered 2016-11-26: 16:00:00 via INTRAVENOUS
  Administered 2016-11-27: 125 mL/h via INTRAVENOUS

## 2016-11-26 MED ORDER — LIDOCAINE HCL (CARDIAC) 20 MG/ML IV SOLN
INTRAVENOUS | Status: DC | PRN
  Administered 2016-11-26: 80 mg via INTRAVENOUS

## 2016-11-26 MED ORDER — ESTRADIOL 0.1 MG/GM VA CREA
TOPICAL_CREAM | VAGINAL | Status: AC
  Filled 2016-11-26: qty 42.5

## 2016-11-26 MED ORDER — LIDOCAINE HCL (CARDIAC) 20 MG/ML IV SOLN
INTRAVENOUS | Status: AC
  Filled 2016-11-26: qty 5

## 2016-11-26 MED ORDER — ACETAMINOPHEN 160 MG/5ML PO SOLN
ORAL | Status: AC
  Administered 2016-11-26: 650 mg via ORAL
  Filled 2016-11-26: qty 20.3

## 2016-11-26 MED ORDER — MORPHINE SULFATE 2 MG/ML IV SOLN
INTRAVENOUS | Status: DC
  Administered 2016-11-26: 13:00:00 via INTRAVENOUS
  Administered 2016-11-26: 6 mg via INTRAVENOUS
  Administered 2016-11-26: 9 mL via INTRAVENOUS
  Administered 2016-11-27: 3 mg via INTRAVENOUS
  Administered 2016-11-27: 5 mg via INTRAVENOUS
  Filled 2016-11-26 (×2): qty 25

## 2016-11-26 MED ORDER — FENTANYL CITRATE (PF) 100 MCG/2ML IJ SOLN
INTRAMUSCULAR | Status: AC
  Administered 2016-11-26: 25 ug via INTRAVENOUS
  Filled 2016-11-26: qty 2

## 2016-11-26 MED ORDER — ROCURONIUM BROMIDE 100 MG/10ML IV SOLN
INTRAVENOUS | Status: DC | PRN
  Administered 2016-11-26: 50 mg via INTRAVENOUS
  Administered 2016-11-26: 10 mg via INTRAVENOUS
  Administered 2016-11-26: 5 mg via INTRAVENOUS

## 2016-11-26 MED ORDER — DEXAMETHASONE SODIUM PHOSPHATE 4 MG/ML IJ SOLN
INTRAMUSCULAR | Status: AC
Start: 2016-11-26 — End: 2016-11-26
  Filled 2016-11-26: qty 1

## 2016-11-26 MED ORDER — ESTRADIOL 0.1 MG/GM VA CREA
TOPICAL_CREAM | VAGINAL | Status: DC | PRN
  Administered 2016-11-26: 1 via VAGINAL

## 2016-11-26 MED ORDER — ONDANSETRON HCL 4 MG PO TABS: 4.0000 mg | ORAL_TABLET | Freq: Four times a day (QID) | ORAL | Status: DC | PRN

## 2016-11-26 MED ORDER — IBUPROFEN 600 MG PO TABS
600.0000 mg | ORAL_TABLET | Freq: Four times a day (QID) | ORAL | Status: DC | PRN
  Administered 2016-11-27: 600 mg via ORAL
  Filled 2016-11-26: qty 1

## 2016-11-26 MED ORDER — ROCURONIUM BROMIDE 100 MG/10ML IV SOLN
INTRAVENOUS | Status: AC
  Filled 2016-11-26: qty 1

## 2016-11-26 MED ORDER — ONDANSETRON HCL 4 MG/2ML IJ SOLN
4.0000 mg | Freq: Four times a day (QID) | INTRAMUSCULAR | Status: DC | PRN
  Administered 2016-11-27: 4 mg via INTRAVENOUS
  Filled 2016-11-26: qty 2

## 2016-11-26 MED ORDER — STERILE WATER FOR IRRIGATION IR SOLN
Status: DC | PRN
  Administered 2016-11-26: 2000 mL

## 2016-11-26 MED ORDER — ONDANSETRON HCL 4 MG/2ML IJ SOLN
INTRAMUSCULAR | Status: AC
  Filled 2016-11-26: qty 2

## 2016-11-26 MED ORDER — SUGAMMADEX SODIUM 200 MG/2ML IV SOLN
INTRAVENOUS | Status: AC
  Filled 2016-11-26: qty 2

## 2016-11-26 MED ORDER — DIPHENHYDRAMINE HCL 50 MG/ML IJ SOLN: 12.5000 mg | Freq: Four times a day (QID) | INTRAMUSCULAR | Status: DC | PRN

## 2016-11-26 MED ORDER — LACTATED RINGERS IV SOLN: INTRAVENOUS | Status: DC

## 2016-11-26 MED ORDER — ONDANSETRON HCL 4 MG/2ML IJ SOLN
INTRAMUSCULAR | Status: DC | PRN
  Administered 2016-11-26: 4 mg via INTRAVENOUS

## 2016-11-26 SURGICAL SUPPLY — 86 items
ADH SKN CLS APL DERMABOND .7 (GAUZE/BANDAGES/DRESSINGS) ×4
BLADE SURG 11 STRL SS (BLADE) ×6 IMPLANT
BLADE SURG 15 STRL LF C SS BP (BLADE) ×4 IMPLANT
BLADE SURG 15 STRL SS (BLADE) ×6
CANISTER SUCT 3000ML (MISCELLANEOUS) ×6 IMPLANT
CATH FOLEY 2WAY SLVR  5CC 18FR (CATHETERS) ×2
CATH FOLEY 2WAY SLVR 5CC 18FR (CATHETERS) ×4 IMPLANT
CATH ROBINSON RED A/P 16FR (CATHETERS) IMPLANT
CLOSURE WOUND 1/4 X3 (GAUZE/BANDAGES/DRESSINGS)
CLOTH BEACON ORANGE TIMEOUT ST (SAFETY) ×6 IMPLANT
CONT PATH 16OZ SNAP LID 3702 (MISCELLANEOUS) ×6 IMPLANT
COVER BACK TABLE 60X90IN (DRAPES) ×6 IMPLANT
DECANTER SPIKE VIAL GLASS SM (MISCELLANEOUS) ×8 IMPLANT
DERMABOND ADVANCED (GAUZE/BANDAGES/DRESSINGS) ×2
DERMABOND ADVANCED .7 DNX12 (GAUZE/BANDAGES/DRESSINGS) ×4 IMPLANT
DEVICE CAPIO SLIM SINGLE (INSTRUMENTS) IMPLANT
DRSG OPSITE POSTOP 3X4 (GAUZE/BANDAGES/DRESSINGS) IMPLANT
DURAPREP 26ML APPLICATOR (WOUND CARE) ×6 IMPLANT
ELECT REM PT RETURN 9FT ADLT (ELECTROSURGICAL) ×6
ELECTRODE REM PT RTRN 9FT ADLT (ELECTROSURGICAL) IMPLANT
FILTER SMOKE EVAC LAPAROSHD (FILTER) ×6 IMPLANT
FORCEPS CUTTING 33CM 5MM (CUTTING FORCEPS) IMPLANT
GAUZE PACKING 2X5 YD STRL (GAUZE/BANDAGES/DRESSINGS) ×6 IMPLANT
GLOVE BIO SURGEON STRL SZ 6.5 (GLOVE) ×10 IMPLANT
GLOVE BIO SURGEONS STRL SZ 6.5 (GLOVE) ×2
GLOVE BIOGEL PI IND STRL 6.5 (GLOVE) ×4 IMPLANT
GLOVE BIOGEL PI IND STRL 7.0 (GLOVE) ×12 IMPLANT
GLOVE BIOGEL PI IND STRL 7.5 (GLOVE) IMPLANT
GLOVE BIOGEL PI INDICATOR 6.5 (GLOVE) ×2
GLOVE BIOGEL PI INDICATOR 7.0 (GLOVE) ×8
GLOVE BIOGEL PI INDICATOR 7.5 (GLOVE) ×8
GOWN STRL REUS W/TWL LRG LVL3 (GOWN DISPOSABLE) ×24 IMPLANT
LEGGING LITHOTOMY PAIR STRL (DRAPES) ×6 IMPLANT
NDL MAYO 6 CRC TAPER PT (NEEDLE) IMPLANT
NEEDLE HYPO 22GX1.5 SAFETY (NEEDLE) ×8 IMPLANT
NEEDLE MAYO 6 CRC TAPER PT (NEEDLE) IMPLANT
NS IRRIG 1000ML POUR BTL (IV SOLUTION) ×6 IMPLANT
OCCLUDER COLPOPNEUMO (BALLOONS) ×2 IMPLANT
PACK LAVH (CUSTOM PROCEDURE TRAY) ×6 IMPLANT
PACK ROBOTIC GOWN (GOWN DISPOSABLE) ×6 IMPLANT
PACK TRENDGUARD 450 HYBRID PRO (MISCELLANEOUS) IMPLANT
PACK TRENDGUARD 600 HYBRD PROC (MISCELLANEOUS) IMPLANT
PACK VAGINAL WOMENS (CUSTOM PROCEDURE TRAY) ×6 IMPLANT
PAD MAGNETIC INST (MISCELLANEOUS) ×6 IMPLANT
PLUG CATH AND CAP STER (CATHETERS) ×6 IMPLANT
PROTECTOR NERVE ULNAR (MISCELLANEOUS) ×12 IMPLANT
SCISSORS LAP 5X35 DISP (ENDOMECHANICALS) IMPLANT
SET CYSTO W/LG BORE CLAMP LF (SET/KITS/TRAYS/PACK) ×6 IMPLANT
SET IRRIG TUBING LAPAROSCOPIC (IRRIGATION / IRRIGATOR) IMPLANT
SLEEVE XCEL OPT CAN 5 100 (ENDOMECHANICALS) IMPLANT
SLING TVT EXACT (Sling) ×2 IMPLANT
SOLUTION ELECTROLUBE (MISCELLANEOUS) ×2 IMPLANT
SPONGE SURGIFOAM ABS GEL 12-7 (HEMOSTASIS) IMPLANT
STRIP CLOSURE SKIN 1/4X3 (GAUZE/BANDAGES/DRESSINGS) IMPLANT
SURGIFLO W/THROMBIN 8M KIT (HEMOSTASIS) IMPLANT
SUT CAPIO ETHIBPND (SUTURE) IMPLANT
SUT VIC AB 0 CT1 18XCR BRD8 (SUTURE) ×12 IMPLANT
SUT VIC AB 0 CT1 27 (SUTURE) ×18
SUT VIC AB 0 CT1 27XBRD ANBCTR (SUTURE) ×12 IMPLANT
SUT VIC AB 0 CT1 36 (SUTURE) ×6 IMPLANT
SUT VIC AB 0 CT1 8-18 (SUTURE) ×12
SUT VIC AB 0 CT2 27 (SUTURE) IMPLANT
SUT VIC AB 2-0 CT1 27 (SUTURE)
SUT VIC AB 2-0 CT1 TAPERPNT 27 (SUTURE) IMPLANT
SUT VIC AB 2-0 CT2 27 (SUTURE) IMPLANT
SUT VIC AB 2-0 SH 27 (SUTURE) ×30
SUT VIC AB 2-0 SH 27XBRD (SUTURE) ×16 IMPLANT
SUT VIC AB 2-0 UR6 27 (SUTURE) IMPLANT
SUT VICRYL 0 TIES 12 18 (SUTURE) ×6 IMPLANT
SUT VICRYL 4-0 PS2 18IN ABS (SUTURE) ×8 IMPLANT
SYR 10ML LL (SYRINGE) ×1 IMPLANT
SYRINGE 60CC LL (MISCELLANEOUS) ×2 IMPLANT
TIP UTERINE 5.1X6CM LAV DISP (MISCELLANEOUS) ×2 IMPLANT
TIP UTERINE 6.7X10CM GRN DISP (MISCELLANEOUS) IMPLANT
TIP UTERINE 6.7X6CM WHT DISP (MISCELLANEOUS) IMPLANT
TIP UTERINE 6.7X8CM BLUE DISP (MISCELLANEOUS) IMPLANT
TOWEL OR 17X24 6PK STRL BLUE (TOWEL DISPOSABLE) ×12 IMPLANT
TRAY FOLEY CATH SILVER 14FR (SET/KITS/TRAYS/PACK) ×6 IMPLANT
TRAY FOLEY CATH SILVER 16FR (SET/KITS/TRAYS/PACK) ×6 IMPLANT
TRENDGUARD 450 HYBRID PRO PACK (MISCELLANEOUS) ×6
TRENDGUARD 600 HYBRID PROC PK (MISCELLANEOUS)
TROCAR XCEL NON-BLD 5MMX100MML (ENDOMECHANICALS) IMPLANT
TUBING NON-CON 1/4 X 20 CONN (TUBING) ×5 IMPLANT
TUBING NON-CON 1/4 X 20' CONN (TUBING) ×1
WARMER LAPAROSCOPE (MISCELLANEOUS) ×6 IMPLANT
WATER STERILE IRR 1000ML POUR (IV SOLUTION) ×6 IMPLANT

## 2016-11-26 NOTE — Progress Notes (Signed)
Day of Surgery Procedure(s) (LRB): LAPAROSCOPIC ASSISTED VAGINAL HYSTERECTOMY WITH SALPINGO OOPHORECTOMY (Bilateral) ANTERIOR (CYSTOCELE) AND POSTERIOR REPAIR (RECTOCELE) (N/A) TRANSVAGINAL TAPE (TVT) PROCEDURE exact midurethral sling (N/A) CYSTOSCOPY (N/A) LAPAROSCOPIC LYSIS OF ADHESIONS (N/A)  Subjective: Patient reports doing well.  Good pain control.  Taking some clear liquids. Not out of bed yet.  Objective: I have reviewed patient's vital signs and intake and output. Vitals:   11/26/16 1415 11/26/16 1515  BP: 134/60 (!) 142/72  Pulse: 66 75  Resp: (!) 21 17  Temp: 98 F (36.7 C) 98.3 F (36.8 C)   I /O - 1600 cc/925 cc.  General: alert and cooperative Resp: clear to auscultation bilaterally Cardio: regular rate and rhythm, S1, S2 normal, no murmur, click, rub or gallop GI: soft, non-tender; bowel sounds normal; no masses,  no organomegaly and incision: clean, dry and intact Extremities: PAS and Ted hose on. DPs 2+bilaterally.  Vaginal Bleeding: minimal  Assessment: s/p Procedure(s): LAPAROSCOPIC ASSISTED VAGINAL HYSTERECTOMY WITH SALPINGO OOPHORECTOMY (Bilateral) ANTERIOR (CYSTOCELE) AND POSTERIOR REPAIR (RECTOCELE) (N/A) TRANSVAGINAL TAPE (TVT) PROCEDURE exact midurethral sling (N/A) CYSTOSCOPY (N/A) LAPAROSCOPIC LYSIS OF ADHESIONS (N/A): stable  Plan: Advance diet Continue foley due to post op status.  Will DC foley and vaginal packing in the am.  Continue PCA overnight.  CBC and BMP in the am.  Discussed surgical findings and procedure.    LOS: 0 days    Arloa Koh 11/26/2016, 4:36 PM

## 2016-11-26 NOTE — Anesthesia Postprocedure Evaluation (Addendum)
Anesthesia Post Note  Patient: Sharon Hardin  Procedure(s) Performed: Procedure(s) (LRB): LAPAROSCOPIC ASSISTED VAGINAL HYSTERECTOMY WITH SALPINGO OOPHORECTOMY (Bilateral) ANTERIOR (CYSTOCELE) AND POSTERIOR REPAIR (RECTOCELE) (N/A) TRANSVAGINAL TAPE (TVT) PROCEDURE exact midurethral sling (N/A) CYSTOSCOPY (N/A) LAPAROSCOPIC LYSIS OF ADHESIONS (N/A)  Patient location during evaluation: [facility] Anesthesia Type: General Level of consciousness: awake, awake and alert, oriented and patient cooperative Pain management: pain level controlled Vital Signs Assessment: post-procedure vital signs reviewed and stable Respiratory status: spontaneous breathing, nonlabored ventilation, respiratory function stable and patient connected to nasal cannula oxygen Cardiovascular status: stable Postop Assessment: no headache and no signs of nausea or vomiting Anesthetic complications: no        Last Vitals:  Vitals:   11/26/16 1313 11/26/16 1415  BP: (!) 144/70 134/60  Pulse: 78 66  Resp: 16 (!) 21  Temp: 37.2 C 36.7 C    Last Pain:  Vitals:   11/26/16 1426  TempSrc:   PainSc: 2    Pain Goal: Patients Stated Pain Goal: 6 (11/26/16 0611)               CARVER,ALISON L

## 2016-11-26 NOTE — Anesthesia Postprocedure Evaluation (Signed)
Anesthesia Post Note  Patient: Sharon Hardin  Procedure(s) Performed: Procedure(s) (LRB): LAPAROSCOPIC ASSISTED VAGINAL HYSTERECTOMY WITH SALPINGO OOPHORECTOMY (Bilateral) ANTERIOR (CYSTOCELE) AND POSTERIOR REPAIR (RECTOCELE) (N/A) TRANSVAGINAL TAPE (TVT) PROCEDURE exact midurethral sling (N/A) CYSTOSCOPY (N/A) LAPAROSCOPIC LYSIS OF ADHESIONS (N/A)  Patient location during evaluation: PACU Anesthesia Type: General Level of consciousness: awake Pain management: pain level controlled Vital Signs Assessment: post-procedure vital signs reviewed and stable Respiratory status: spontaneous breathing Cardiovascular status: stable Postop Assessment: no signs of nausea or vomiting Anesthetic complications: no        Last Vitals:  Vitals:   11/26/16 1200 11/26/16 1210  BP: (!) 118/91   Pulse: 74 81  Resp: 14 12  Temp:      Last Pain:  Vitals:   11/26/16 1210  TempSrc:   PainSc: 4    Pain Goal: Patients Stated Pain Goal: 6 (11/26/16 [id number])               [name],[name]

## 2016-11-26 NOTE — Addendum Note (Signed)
Addendum  created 11/26/16 1533 by Raenette Rover, CRNA   Sign clinical note

## 2016-11-26 NOTE — Op Note (Addendum)
OPERATIVE REPORT   PREOPERATIVE DIAGNOSES:   Incomplete uterovaginal prolapse, genuine stress incontinence.  POSTOPERATIVE DIAGNOSES:   Incomplete uterovagainal prolapse, genuine stress Incontinence.  PROCEDURES:  Laparoscopically assisted vaginal hysterectomy with bilateral salpingo-oophorectomy, anterior and posterior colporrhaphy, TVT Exact midurethral sling, cystoscopy, and lysis of adhesions.  SURGEON:  Lenard Galloway, M.D.  ASSISTANT:   Dorothy Spark, M.D.  ANESTHESIA:  General endotracheal, local with 1% lidocaine with epinephrine, 1:100,000.  EBL:   150 cc.   URINE OUTPUT:  400 cc.  IV FLUIDS:   1400 cc LR.  COMPLICATIONS:  None.  INDICATIONS FOR THE PROCEDURE:  The patient is a 71 year old para 2 female who presents with pelvic organ prolapse and stress incontinence.  On physical exam, the patient was noted to have a third degree cystocele, first degree uterine prolapse, and a minimal rectocele. The patient underwent multichannel urodynamic testing which confirmed the stress incontinence. The patient desires surgical repair and a plan is made to proceed now with a laparoscopically assisted vaginal hysterectomy, anterior and posterior colporrhaphy along with the TVT Exact midurethral sling and cystoscopy.  Risks, benefits, and alternatives are reviewed with the patient, who wishes to proceed.  FINDINGS:  Examination under anesthesia revealed a third degree cystocele, first degree uterine prolapse, and a minimal rectocele.   The bladder was visualized throughout 360 degrees and was normal.  There  was no foreign body in the bladder or the urethra.  The ureters were noted to  be patent bilaterally.  SPECIMENS:  Uterus, cervix, bilateral tubes and ovaries were sent to pathology.  DESCRIPTION OF PROCEDURE:  The patient was reidentified in the preoperative hold area.  She received Cefotetan 2 grams IV for antibiotic prophylaxis.  She received TED hose and PAS stockings for  DVT prophylaxis.  The patient was transferred to the operating room where she was placed in the dorsal lithotomy position with Allen stirrups.  General endotracheal anesthesia was induced.  The Trendgard was used to support the patient on the OR table.The patient's lower abdomen, vagina and perineum were then sterilely prepped and she was draped.  A Foley catheter was sterilely placed inside the bladder and left to gravity drainage throughout the procedure and at the termination of the procedure.  An examination under anesthesia was performed.  A speculum was placed in the vagina and a single-tooth tenaculum was placed on the anterior cervical lip. A figure-of-eight suture of 0 Vicryl was placed on each the anterior and the posterior cervical lips. The uterus was sounded to 6 cm. The cervix was then dilated with [facility] dilators. An attempt was made to place a RUMI manipulator with a KOH ring, however, the RUMI tip would not fit well inside the uterine cavity.  A Hulka uterine manipulator was therefore place. The speculum was removed.  A Foley catheter was placed inside the bladder and left to gravity drainage.   Attention was turned to the abdomen where the umbilical region was injected with 0.25% Marcaine and a small incision created.  A Veress needle was then used to insufflate the abdomen with CO2 gas after a saline drop test was performed and the fluid flowed freely.  A 5 mm umbilical incision was created with a scalpel after the skin. A 5 mm camera port was then placed using the Optiview. 5 mm incisions were then created in the left lower abdomen and the right lower abdomen after the skin was injected locally with 0.25% Marcaine.  The 5 mm trocars were then  placed under visualization of the laparoscope.     At this time, the patient was placed in Trendelenburg position. An inspection of the abdomen and pelvis was performed. The findings are as noted above.    Adhesions were noted between the  left fallopian tube and small bowel.  These were lysed with sharp dissection with a laparoscopic scissors.  Large bowel congenital adhesions to the left pelvic brim were also sharply lysed. The bilateral ureters were identified.    The left infundibulopelvic ligament was grasped and the Ligasure was used to cauterize and cut the pedicle.  The left round ligament was then cauterized and divided with same instrument. Dissection was performed to the anterior and posterior leaves of the broad ligaments using the monopolar laparoscopic scissors. The incision was carried across the anterior cul- de-sac along the vesicouterine fold and the bladder was dissected away from the cervix using the laparoscopic scissors. The peritoneum was taken down posteriorly. The left uterine artery was skeletonized at this time using sharp dissection and monopolar cautery.   It was then cauterized and cut with the Ligasure instrument.  Attention was turned to the patient's right-hand side at this time. The same procedure that was performed on the left side was repeated on the right side with respect to the isolation, cautery, and transection of the vessels and the bladder flap dissection.      Hemostasis was good and the remainder of the procedure continued vaginally with the exception of final laparoscopy to confirm hemostasis at the end of the surgery.  The laparoscopic ports were left in place and the instruments were removed from the peritoneal cavity.  A weight speculum was placed in the vagina. A single tooth tenaculum was placed on the anterior cervical lip and a Jacob's tenaculum was placed on the posterior cervical lip.  The cervix was injected locally with 1% lidocaine with epinephrine, 1:100,000.  The cervix was circumscribed with a scalpel and a Mayo scissors was used to dissect into the endocervical fascia around the cervix.  The posterior cul de sac was entered sharply with the Mayo scissors.  The posterior vaginal  cuff was tagged with a suture of 0/0 Vicryl.  The long weighted speculum was placed in the posterior cul de sac.  The uterosacral ligaments were then clamped with Haney clamps, sharply divided, and suture ligated with transfixing sutures of 0/0 Vicryl. The anterior cul de sac was entered sharply with a Metzenbaum scissors, and a Dever retractor was placed in the anterior cul de sac. The remaining cardinal ligaments were clamped with a Haney clamp, sharply divided, and suture ligated with 0/0 Vicryl.  The specimen was free from all pedicles and was sent to pathology.   The posterior vaginal cuff was whip stitched with 0/0 Vicryl.  A McCall's culdoplasty was performed with 0/0 Vicryl.  The suture was brought from the vaginal cuff at 6:00 to inside the cul de sac.  It was brought through the uterosacral ligament, across the posterior cul de sac, down through the right uterosacral ligament, and the in the cul de sac and back out at 6:00.  The suture was held and tied at the end of the vaginal procedure.    Allis clamps were used to mark the anterior vaginal wall from 1 cm below the urethra to the vaginal apex.  The anterior vaginal wall mucosa was injected locally with 1% lidocaine with epinephrine, 1:100,000.  The vaginal mucosa was then incised vertically in the midline with a Metzenbaum  scissors.  With a combination of sharp and blunt dissection, the subvaginal tissue was dissected off the bladder bilaterally.  The dissection was carried back to the pubic rami anteriorly.    The TVT Exact midurethral sling was performed.  The 1 cm suprapubic incisions were created with a scalpel to the right and left of the midline.  The TVT Exact was performed in a bottom-up fashion.  The Foley catheter was removed and the Foley tip with the obturator guide was placed inside the urethra and deflected properly.  The guide was placed through the right retropubic space and then up through the right suprapubic incision.  This  was performed without difficulty.  The urethra was deflected in opposite direction and the same was then performed on the patient's left-hand side.  The obturator guide was removed and cystoscopy was performed and the findings were as noted above.  All cystoscopic fluid was drained and the Foley catheter was replaced. The sling was brought up through the suprapubic incisions bilaterally. A Kelly clamp was placed between the sling and the urethra, and the plastic sheaths were removed.  The sling was trimmed suprapubically. The sling was noted to be in good position.    The anterior colporrhaphy was performed with vertical mattress sutures of 0 Vicryl for reduction of the cystocele. Hemostasis was good.  The anterior vaginal wall mucosa was trimmed and then the anterior vaginal wall was closed with a running locked suture of 2-0 Vicryl.  The posterior colporrhaphy was performed at this time.  Allis clamps were used to mark the perineal body and then the posterior vaginal mucosa up to the level of the vaginal apex.  The perineal body and mucosa were injected locally with 1% lidocaine with epinephrine, 1:100,000.  A triangular wedge of epithelium was excised from the perineal body and the posterior mucosa was incised vertically in the midline with a Metzenbaum scissors.  Sharp and blunt dissection were used to dissect the perirectal fascia off the vaginal mucosa bilaterally.  The rectocele was reduced at this time.   Vertical mattress sutures of 0 Vicryl were then used to reduce the rectocele. Excess vaginal mucosa was trimmed at this time and the posterior vaginal wall was closed with a running lock suture of 2-0 Vicryl down to the hymen.  This suture was then brought behind the hymen. The 2-0 Vicryl was used to then place running sutures along the transverse superficial perineal muscles.  This suture was then brought up the perineum in a subcuticular fashion and the knot was tied at the hymen as for an  episiotomy-type repair.  Rectal exam was performed and there was no evidence of any suture or foreign body in the rectum.  There was good support and elevation to the anterior apical and posterior vaginal walls.  A gauze packing with Estrace cream was placed inside the vagina.  All vaginal instruments were remove.   Final laparoscopy was performed after reinsufflation with CO2 gas.  Hemostasis was noted in the surgical field.   The right and left lower abdominal trocars were removed under visualization of the laparoscope.  The pneumoperitoneum was released, and the umbilical trocar was removed.   The right and left lower trocar sites and the suprapubic incisions were closed with subcuticular suture of 4/0 Vicryl. All incisions were then closed with Dermabond.   The patient was awakened and extubated, and escorted to the recovery room in stable condition.  There were no complications.  All needle, instrument, and sponge  counts were correct.     Lenard Galloway, M.D.

## 2016-11-26 NOTE — Transfer of Care (Signed)
Immediate Anesthesia Transfer of Care Note  Patient: Sharon Hardin  Procedure(s) Performed: Procedure(s): LAPAROSCOPIC ASSISTED VAGINAL HYSTERECTOMY WITH SALPINGO OOPHORECTOMY (Bilateral) ANTERIOR (CYSTOCELE) AND POSTERIOR REPAIR (RECTOCELE) (N/A) TRANSVAGINAL TAPE (TVT) PROCEDURE exact midurethral sling (N/A) CYSTOSCOPY (N/A) LAPAROSCOPIC LYSIS OF ADHESIONS (N/A)  Patient Location: PACU  Anesthesia Type:General  Level of Consciousness: awake, alert  and oriented  Airway & Oxygen Therapy: Patient Spontanous Breathing and Patient connected to nasal cannula oxygen  Post-op Assessment: Report given to RN and Post -op Vital signs reviewed and stable  Post vital signs: Reviewed and stable HR 84, RR 18, BP 151/71, Sao2 97%  Last Vitals:  Vitals:   11/26/16 0611  BP: 139/76  Pulse: 71  Resp: 20  Temp: 36.7 C    Last Pain:  Vitals:   11/26/16 0611  TempSrc: Oral      Patients Stated Pain Goal: 6 ([id number])  Complications: No apparent anesthesia complications

## 2016-11-26 NOTE — Anesthesia Procedure Notes (Signed)
Procedure Name: Intubation Date/Time: 11/26/2016 7:29 AM Performed by: Elenore Paddy Pre-anesthesia Checklist: Patient identified, Emergency Drugs available, Suction available, Patient being monitored and Timeout performed Patient Re-evaluated:Patient Re-evaluated prior to inductionOxygen Delivery Method: Circle system utilized Preoxygenation: Pre-oxygenation with 100% oxygen Intubation Type: IV induction Ventilation: Oral airway inserted - appropriate to patient size and Mask ventilation without difficulty Laryngoscope Size: Mac and 3 Grade View: Grade I Tube type: Oral Tube size: 7.0 mm Number of attempts: 1 Airway Equipment and Method: Stylet Secured at: 20 cm Tube secured with: Tape Dental Injury: Teeth and Oropharynx as per pre-operative assessment

## 2016-11-26 NOTE — Brief Op Note (Signed)
11/26/2016  11:13 AM  PATIENT:  Sharon Hardin  71 y.o. female  PRE-OPERATIVE DIAGNOSIS:  Incomplete uterovaginal prolapse, genuine SUI  POST-OPERATIVE DIAGNOSIS:  Incomplete uterovaginal prolapse, genuine SUI  PROCEDURE:  Procedure(s): LAPAROSCOPIC ASSISTED VAGINAL HYSTERECTOMY WITH SALPINGO OOPHORECTOMY (Bilateral) ANTERIOR (CYSTOCELE) AND POSTERIOR REPAIR (RECTOCELE) (N/A) TRANSVAGINAL TAPE (TVT) PROCEDURE exact midurethral sling (N/A) CYSTOSCOPY (N/A) LAPAROSCOPIC LYSIS OF ADHESIONS (N/A)  SURGEON:  Surgeon(s) and Role:    * Tenae Graziosi E Yisroel Ramming, MD - Primary    * Salvadore Dom, MD  PHYSICIAN ASSISTANT: NA  ASSISTANTS: Salvadore Dom, MD   ANESTHESIA:   local and general  EBL:  Total I/O In: 1400 [I.V.:1400] Out: 550 [Urine:400; Blood:150]  BLOOD ADMINISTERED:none  DRAINS: Urinary Catheter (Foley)   LOCAL MEDICATIONS USED:  LIDOCAINE with EPINEPHRINE 1:100,000  SPECIMEN:  Source of Specimen:  Uterus, cervix, bilateral tubes and ovaries.  DISPOSITION OF SPECIMEN:  PATHOLOGY  COUNTS:  YES  TOURNIQUET:  * No tourniquets in log *  DICTATION: .Note written in [place]: Admit for overnight observation  PATIENT DISPOSITION:  PACU - hemodynamically stable.   Delay start of Pharmacological VTE agent (>24hrs) due to surgical blood loss or risk of bleeding: not applicable

## 2016-11-26 NOTE — Progress Notes (Signed)
Update to History and Physical  Now on Lisinopril for HTN.  Feels better.   Patient's mammogram was cancelled due to snow last week.  She will reschedule.   Patient examined.   Ok to proceed with surgery.

## 2016-11-27 ENCOUNTER — Encounter (HOSPITAL_COMMUNITY): Payer: Self-pay | Admitting: Obstetrics and Gynecology

## 2016-11-27 DIAGNOSIS — N812 Incomplete uterovaginal prolapse: Secondary | ICD-10-CM | POA: Diagnosis not present

## 2016-11-27 LAB — BASIC METABOLIC PANEL
ANION GAP: 6 (ref 5–15)
BUN: 8 mg/dL (ref 6–20)
CHLORIDE: 103 mmol/L (ref 101–111)
CO2: 26 mmol/L (ref 22–32)
CREATININE: 0.53 mg/dL (ref 0.44–1.00)
Calcium: 8.4 mg/dL — ABNORMAL LOW (ref 8.9–10.3)
GFR calc Af Amer: >60 mL/min (ref >60)
GFR calc non Af Amer: >60 mL/min (ref >60)
Glucose, Bld: 117 mg/dL — ABNORMAL HIGH (ref 65–99)
POTASSIUM: 3.8 mmol/L (ref 3.5–5.1)
Sodium: 135 mmol/L (ref 135–145)

## 2016-11-27 LAB — CBC WITH DIFFERENTIAL/PLATELET
Basophils Absolute: 0 10*3/uL (ref 0.0–0.1)
Basophils Relative: 0 %
Eosinophils Absolute: 0.1 10*3/uL (ref 0.0–0.7)
Eosinophils Relative: 1 %
HEMATOCRIT: 34.2 % — AB (ref 36.0–46.0)
HEMOGLOBIN: 11.2 g/dL — AB (ref 12.0–15.0)
LYMPHS ABS: 2.3 10*3/uL (ref 0.7–4.0)
LYMPHS PCT: 19 %
MCH: 30.9 pg (ref 26.0–34.0)
MCHC: 32.7 g/dL (ref 30.0–36.0)
MCV: 94.2 fL (ref 78.0–100.0)
MONOS PCT: 4 %
Monocytes Absolute: 0.4 10*3/uL (ref 0.1–1.0)
NEUTROS ABS: 9.4 10*3/uL — AB (ref 1.7–7.7)
NEUTROS PCT: 76 %
Platelets: 313 10*3/uL (ref 150–400)
RBC: 3.63 MIL/uL — AB (ref 3.87–5.11)
RDW: 13.4 % (ref 11.5–15.5)
WBC: 12.2 10*3/uL — AB (ref 4.0–10.5)

## 2016-11-27 LAB — CBC
HEMATOCRIT: 36.4 % (ref 36.0–46.0)
HEMOGLOBIN: 11.5 g/dL — AB (ref 12.0–15.0)
MCH: 29.9 pg (ref 26.0–34.0)
MCHC: 31.6 g/dL (ref 30.0–36.0)
MCV: 94.8 fL (ref 78.0–100.0)
Platelets: 322 10*3/uL (ref 150–400)
RBC: 3.84 MIL/uL — AB (ref 3.87–5.11)
RDW: 13.4 % (ref 11.5–15.5)
WBC: 15.7 10*3/uL — ABNORMAL HIGH (ref 4.0–10.5)

## 2016-11-27 MED ORDER — IBUPROFEN 600 MG PO TABS: 600.0000 mg | ORAL_TABLET | Freq: Four times a day (QID) | ORAL | 0 refills | Status: DC | PRN

## 2016-11-27 MED ORDER — OXYCODONE-ACETAMINOPHEN 5-325 MG PO TABS: 1.0000 | ORAL_TABLET | ORAL | 0 refills | Status: DC | PRN

## 2016-11-27 NOTE — Discharge Instructions (Signed)
Laparoscopically Assisted Vaginal Hysterectomy, Care After Refer to this sheet in the next few weeks. These instructions provide you with information on caring for yourself after your procedure. Your health care provider may also give you more specific instructions. Your treatment has been planned according to current medical practices, but problems sometimes occur. Call your health care provider if you have any problems or questions after your procedure. What can I expect after the procedure? After your procedure, it is typical to have the following:  Abdominal pain. You will be given pain medicine to control it.  Sore throat from the breathing tube that was inserted during surgery. Follow these instructions at home:  Only take over-the-counter or prescription medicines for pain, discomfort, or fever as directed by your health care provider.  Do not take aspirin. It can cause bleeding.  Do not drive when taking pain medicine.  Follow your health care provider's advice regarding diet, exercise, lifting, driving, and general activities.  Resume your usual diet as directed and allowed.  Get plenty of rest and sleep.  Do not douche, use tampons, or have sexual intercourse for at least 6 weeks, or until your health care provider gives you permission.  Change your bandages (dressings) as directed by your health care provider.  Monitor your temperature and notify your health care provider of a fever.  Take showers instead of baths for 2-3 weeks.  Do not drink alcohol until your health care provider gives you permission.  If you develop constipation, you may take a mild laxative with your health care provider's permission. Bran foods may help with constipation problems. Drinking enough fluids to keep your urine clear or pale yellow may help as well.  Try to have someone home with you for 1-2 weeks to help around the house.  Keep all of your follow-up appointments as directed by your  health care provider. Contact a health care provider if:  You have swelling, redness, or increasing pain around your incision sites.  You have pus coming from your incision.  You notice a bad smell coming from your incision.  Your incision breaks open.  You feel dizzy or lightheaded.  You have pain or bleeding when you urinate.  You have persistent diarrhea.  You have persistent nausea and vomiting.  You have abnormal vaginal discharge.  You have a rash.  You have any type of abnormal reaction or develop an allergy to your medicine.  You have poor pain control with your prescribed medicine. Get help right away if:  You have a fever.  You have severe abdominal pain.  You have chest pain.  You have shortness of breath.  You faint.  You have pain, swelling, or redness in your leg.  You have heavy vaginal bleeding with blood clots. This information is not intended to replace advice given to you by your health care provider. Make sure you discuss any questions you have with your health care provider. Document Released: 10/10/2011 Document Revised: 03/28/2016 Document Reviewed: 05/06/2013 Elsevier Interactive Patient Education  2017 [employer]  Anterior and Posterior Colporrhaphy, Sling Procedure, Care After Refer to this sheet in the next few weeks. These instructions provide you with information on caring for yourself after your procedure. Your health care provider may also give you more specific instructions. Your treatment has been planned according to current medical practices, but problems sometimes occur. Call your health care provider if you have any problems or questions after your procedure.  HOME CARE INSTRUCTIONS  Rest as much  as possible during the first 2 weeks after the procedure.   Avoid heavy lifting (more than 10 pounds [4.5 kg]), pushing, or pulling. Limit stair climbing to once or twice a day the first week, then slowly increase this activity.    Avoid standing for prolonged periods of time.   Talk with your health care provider about when you may resume your usual physical activity.   You may resume your normal diet right away.   Drink at least 6-8 glasses of non-caffeinated beverages per day.   Eat a well-balanced diet. Daily portions of food from the meat (protein), milk, fruit, vegetable, and bread families are necessary for your health.   Your normal bowel function should return. If you become constipated, you may:   Take a mild laxative.  Add fruit and bran to your diet.  Drink more liquids.  You may take a shower and wash your hair.   Only take over-the-counter or prescription medicines as directed by your health care provider.   Clean the incision with water. Do not use a dressing unless the incision is draining or irritated. Check your incision daily for redness, draining, swelling, or separation of the skin.   Follow any bladder care instructions provided by your health care provider.   Keep your perineal area (the area between vagina and rectum) clean and dry. Perform perineal care after every bowel movement and each time you urinate. You may take a sitz bath or sit in a tub of clean, warm water when necessary, unless your health care provider tells you otherwise.   Do not have sexual intercourse until permitted by your health care provider.   Follow up with your health care provider as directed.  SEEK MEDICAL CARE IF:  You have shaking chills.   Your pain is not relieved with medicine or becomes worse.  You have frequent or urgent urination, or you are unable to completely empty your bladder.   You feel a burning sensation when urinating.   You see pus coming from the wounds.  SEEK IMMEDIATE MEDICAL CARE IF:  You develop a fever.  You notice redness, drainage, swelling, or separation of the skin at the incision site.  You have difficulty breathing.  You are unable to  urinate. MAKE SURE YOU:   Understand these instructions.  Will watch your condition.  Will get help right away if you are not doing well or get worse. This information is not intended to replace advice given to you by your health care provider. Make sure you discuss any questions you have with your health care provider. Document Released: 06/04/2004 Document Revised: 06/23/2013 Document Reviewed: 03/12/2013 Elsevier Interactive Patient Education  2017 [employer].

## 2016-11-27 NOTE — Progress Notes (Signed)
Discharge teaching complete. Pt understood all instructions and did not have any questions. Pt ambulated out of the hospital and discharged home to family.  

## 2016-11-27 NOTE — Progress Notes (Signed)
1 Day Post-Op Procedure(s) (LRB): LAPAROSCOPIC ASSISTED VAGINAL HYSTERECTOMY WITH SALPINGO OOPHORECTOMY (Bilateral) ANTERIOR (CYSTOCELE) AND POSTERIOR REPAIR (RECTOCELE) (N/A) TRANSVAGINAL TAPE (TVT) PROCEDURE exact midurethral sling (N/A) CYSTOSCOPY (N/A) LAPAROSCOPIC LYSIS OF ADHESIONS (N/A)  Subjective: Patient reports no problems voiding.   Wants discharge.  Feels good overall.   Objective: I have reviewed patient's vital signs, intake and output and labs. Vitals:   11/27/16 1128 11/27/16 1627  BP: (!) 133/50 (!) 134/55  Pulse: 81 77  Resp: 18 18  Temp: 99.3 F (37.4 C) 98.5 F (36.9 C)   Cathed once for PVR of 250 cc.  Other bladder ultrasound amounts of PVR have been 150 or less.  WBC is 12.2 with absolute neutrophils 9.4.   General: alert and cooperative Resp: clear to auscultation bilaterally Cardio: regular rate and rhythm, S1, S2 normal, no murmur, click, rub or gallop GI: soft, non-tender; bowel sounds normal; no masses,  no organomegaly and incision: clean, dry and intact Vaginal Bleeding: minimal  Assessment: s/p Procedure(s): LAPAROSCOPIC ASSISTED VAGINAL HYSTERECTOMY WITH SALPINGO OOPHORECTOMY (Bilateral) ANTERIOR (CYSTOCELE) AND POSTERIOR REPAIR (RECTOCELE) (N/A) TRANSVAGINAL TAPE (TVT) PROCEDURE exact midurethral sling (N/A) CYSTOSCOPY (N/A) LAPAROSCOPIC LYSIS OF ADHESIONS (N/A): ready for discharge.  Plan: Discharge home  Rx for Percocet and Motrin.  Discharge instructions reviewed in verbal and written form.  Follow up in 5 days.    LOS: 0 days    Arloa Koh 11/27/2016, 5:42 PM

## 2016-11-27 NOTE — Progress Notes (Signed)
1 Day Post-Op Procedure(s) (LRB): LAPAROSCOPIC ASSISTED VAGINAL HYSTERECTOMY WITH SALPINGO OOPHORECTOMY (Bilateral) ANTERIOR (CYSTOCELE) AND POSTERIOR REPAIR (RECTOCELE) (N/A) TRANSVAGINAL TAPE (TVT) PROCEDURE exact midurethral sling (N/A) CYSTOSCOPY (N/A) LAPAROSCOPIC LYSIS OF ADHESIONS (N/A)  Subjective: Patient reports tolerating PO and + flatus.   No void yet.  Taking oral pain medication.   Objective: I have reviewed patient's vital signs, intake and output and labs. Vitals:   11/27/16 0227 11/27/16 0527  BP:  (!) 146/68  Pulse:  80  Resp: 20 18  Temp:  98.9 F (37.2 C)    I/O - 1600 cc/2375 cc.  Hgb - 11.5,  WBC 15.7  General: alert and cooperative Resp: clear to auscultation bilaterally Cardio: regular rate and rhythm, S1, S2 normal, no murmur, click, rub or gallop GI: soft, non-tender; bowel sounds normal; no masses,  no organomegaly and incision: clean, dry and intact Extremities: Pas and Ted hose on.  Vaginal Bleeding: minimal  Assessment: s/p Procedure(s): LAPAROSCOPIC ASSISTED VAGINAL HYSTERECTOMY WITH SALPINGO OOPHORECTOMY (Bilateral) ANTERIOR (CYSTOCELE) AND POSTERIOR REPAIR (RECTOCELE) (N/A) TRANSVAGINAL TAPE (TVT) PROCEDURE exact midurethral sling (N/A) CYSTOSCOPY (N/A) LAPAROSCOPIC LYSIS OF ADHESIONS (N/A):  elevated WBC.   No fever or signs of infection.  May be due to dissection of surgery.  Plan:  Will repeat CBC and add diff for 3:00 pm today.  Voiding trials.  Ambulate and shower today.  Anticipated potential discharge this evening.   LOS: 0 days    Arloa Koh 11/27/2016, 8:05 AM

## 2016-12-01 NOTE — Discharge Summary (Signed)
Physician Discharge Summary  Patient ID: Sharon Hardin MRN: [id number] DOB/AGE: [birth year] 71 y.o.  Admit date: 11/26/2016 Discharge date:  11/27/16 Admission Diagnoses: 1.  Incomplete uterovaginal prolapse.  2.  Genuine stress incontinence.   Discharge Diagnoses:  1.  Incomplete uterovaginal prolapse.  2.  Genuine stress incontinence.  3.  Status post laparoscopically assisted vaginal hysterectomy with bilateral salpingo-oophorectomy, lysis of adhesions, anterior and posterior colporrhaphy, TVT Exact midurethral sling and cystoscopy.  4.  Post op leukocytosis, resolving.   Active Problems:   Status post laparoscopy-assisted vaginal hysterectomy   Discharged Condition: good  Hospital Course:  The patient was admitted on 11/26/16  for a laparoscopically assisted vaginal hysterectomy with bilateral salpingo-oophorectomy, lysis of adhesions, anterior and posterior colporrhaphy, TVT Exact midurethral sling and cystoscopy  which were performed without complication while under general anesthesia.  The patient's post op course was uneventful.  She had a morphine PCA for pain control initially, and this was converted over to Percocet and Motrin on post op day one when the patient began taking po well.  She ambulated independently and wore PAS and Ted hose for DVT prophylaxis while in bed.  The patient's vital signs remained stable. The patient's post op day one Hgb was 11.5.  She was tolerating the this well.  Her WBC was 15.7.  She had very minimal vaginal bleeding, and her incision(s) demonstrated no signs of erythema or significant drainage. Her vaginal packing and foley catheter were removed on post op day one. Her bladder training demonstrated adequate voiding and post void residuals of 150 cc once and otherwise less than this.  Her CBC was rechecked in the afternoon on past op day one, and it reduced to 12.2.  She had an absolute neutrophil count of 9.4 K/ul.  She had one low grade temp elevated  to 99.3 int he morning of discharge.  This was attributed to atelectasis as she was not using her incentive spirometer well.  Her maximum volume was about 1700 cc.  She had no cough or congestion symptoms.  She was found to be in good condition and ready for discharge on post op day one.  Consults: None  Significant Diagnostic Studies: labs:  See Hospital Course.  Treatments: surgery:  Laparoscopically assisted vaginal hysterectomy with bilateral salpingo-oophorectomy, lysis of adhesions, anterior and posterior colporrhaphy, TVT Exact midurethral sling and cystoscopy on 11/26/16.  Discharge Exam: Blood pressure (!) 134/55, pulse 77, temperature 98.5 F (36.9 C), temperature source Oral, resp. rate 18, height 5\' 3"  (1.6 m), weight 186 lb 8 oz (84.6 kg), last menstrual period 11/05/2011, SpO2 97 %. General: alert and cooperative Resp: clear to auscultation bilaterally Cardio: regular rate and rhythm, S1, S2 normal, no murmur, click, rub or gallop GI: soft, non-tender; bowel sounds normal; no masses,  no organomegaly and incision: clean, dry and intact Vaginal Bleeding: minimal  Disposition: 01-Home or Self Care Instructions reviewed in verbal and written form.   Allergies as of 11/27/2016   No Known Allergies     Medication List    TAKE these medications   Biotin 2500 MCG Caps Take 2,500 mcg by mouth daily.   D 2000 2000 units Tabs Generic drug:  Cholecalciferol Take 2,000 Units by mouth daily.   escitalopram 10 MG tablet Commonly known as:  LEXAPRO Take 1 tablet (10 mg total) by mouth daily.   ibuprofen 600 MG tablet Commonly known as:  ADVIL,MOTRIN Take 1 tablet (600 mg total) by mouth every 6 (six) hours as needed (mild  pain). What changed:  medication strength  how much to take  reasons to take this   lisinopril 10 MG tablet Commonly known as:  PRINIVIL,ZESTRIL Take 10 mg by mouth daily.   multivitamin with minerals Tabs tablet Take 1 tablet by mouth daily.    oxyCODONE-acetaminophen 5-325 MG tablet Commonly known as:  PERCOCET/ROXICET Take 1-2 tablets by mouth every 4 (four) hours as needed for severe pain (moderate to severe pain (when tolerating fluids)).      Follow-up Information    Arloa Koh, MD In 5 days.   Specialty:  Obstetrics and Gynecology Contact information: [address] [place] [address] [phone]           Signed: Arloa Koh 12/01/2016, 11:47 AM

## 2016-12-02 ENCOUNTER — Ambulatory Visit (INDEPENDENT_AMBULATORY_CARE_PROVIDER_SITE_OTHER): Payer: Medicare Other | Admitting: Obstetrics and Gynecology

## 2016-12-02 ENCOUNTER — Encounter: Payer: Self-pay | Admitting: Obstetrics and Gynecology

## 2016-12-02 ENCOUNTER — Telehealth: Payer: Self-pay | Admitting: Obstetrics and Gynecology

## 2016-12-02 VITALS — BP 138/76 | HR 84 | Temp 98.1°F | Resp 16 | Ht 63.5 in | Wt 189.0 lb

## 2016-12-02 DIAGNOSIS — D72829 Elevated white blood cell count, unspecified: Secondary | ICD-10-CM

## 2016-12-02 DIAGNOSIS — Z9889 Other specified postprocedural states: Secondary | ICD-10-CM

## 2016-12-02 LAB — CBC WITH DIFFERENTIAL/PLATELET
BASOS ABS: 0 {cells}/uL (ref 0–200)
Basophils Relative: 0 %
EOS ABS: 370 {cells}/uL (ref 15–500)
Eosinophils Relative: 5 %
HEMATOCRIT: 37.3 % (ref 35.0–45.0)
HEMOGLOBIN: 12.1 g/dL (ref 11.7–15.5)
LYMPHS ABS: 1480 {cells}/uL (ref 850–3900)
Lymphocytes Relative: 20 %
MCH: 30 pg (ref 27.0–33.0)
MCHC: 32.4 g/dL (ref 32.0–36.0)
MCV: 92.3 fL (ref 80.0–100.0)
MONO ABS: 592 {cells}/uL (ref 200–950)
MPV: 10 fL (ref 7.5–12.5)
Monocytes Relative: 8 %
NEUTROS PCT: 67 %
Neutro Abs: 4958 cells/uL (ref 1500–7800)
Platelets: 415 10*3/uL — ABNORMAL HIGH (ref 140–400)
RBC: 4.04 MIL/uL (ref 3.80–5.10)
RDW: 13 % (ref 11.0–15.0)
WBC: 7.4 10*3/uL (ref 3.8–10.8)

## 2016-12-02 NOTE — Telephone Encounter (Signed)
Left patient a message to call back to reschedule a future appointment that was cancelled by the provider.   Return in about 1 week (around 12/09/2016) for recheck - Dr. Quincy Simmonds. NR 12/02/16 lm for pt to call back to confirm new dr cx/rs from 1:00 to 2:30/[place]

## 2016-12-02 NOTE — Progress Notes (Signed)
GYNECOLOGY  VISIT   HPI: 71 y.o.   Widowed  Caucasian  female   G78P2 with Patient's last menstrual period was 11/05/2011 (approximate).   here for  1 week post op   LAPAROSCOPIC ASSISTED VAGINAL HYSTERECTOMY WITH SALPINGO OOPHORECTOMY (Bilateral Abdomen); ANTERIOR (CYSTOCELE) AND POSTERIOR REPAIR (RECTOCELE) (N/A Vagina ); TRANSVAGINAL TAPE (TVT) PROCEDURE exact midurethral sling (N/A Vagina ); CYSTOSCOPY (N/A Bladder); LAPAROSCOPIC LYSIS OF ADHESIONS (N/A Abdomen)   Feels like she is voiding well.  Up 2 times a night and emptying every 2 - 2.5 hours during the day.  Feels her stream is stronger than it used to be.  Feels like she does leak a little bit just from walking around.  Has leaked with a cough but not every time.  Has moisture in underwear - pink not yellow.  Feels a little uncomfortable at times.  Stopped Percocet 4 days ago.  BMs are normal.   Final path - adenomyosis. Benign endometrium, cervix, tubes and ovaries.  GYNECOLOGIC HISTORY: Patient's last menstrual period was 11/05/2011 (approximate). Contraception:  Hysterectomy Menopausal hormone therapy:  None  Last mammogram:  10-06-13 Density B/Neg/BiRads1:[facility] Last pap smear:   10-18-16 Negative          OB History    Gravida Para Term Preterm AB Living   2 2       2    SAB TAB Ectopic Multiple Live Births                     Patient Active Problem List   Diagnosis Date Noted  . Status post laparoscopy-assisted vaginal hysterectomy 11/26/2016  . Hypertension     Past Medical History:  Diagnosis Date  . Anxiety   . Arthritis   . Depression   . Elevated cholesterol   . Hypertension   . No pertinent past medical history   . PMB (postmenopausal bleeding) 03/10/2012    Past Surgical History:  Procedure Laterality Date  . ANTERIOR AND POSTERIOR REPAIR N/A 11/26/2016   Procedure: ANTERIOR (CYSTOCELE) AND POSTERIOR REPAIR (RECTOCELE);  Surgeon: Nunzio Cobbs, MD;  Location: [facility];   Service: Gynecology;  Laterality: N/A;  . BLADDER SUSPENSION N/A 11/26/2016   Procedure: TRANSVAGINAL TAPE (TVT) PROCEDURE exact midurethral sling;  Surgeon: Nunzio Cobbs, MD;  Location: [facility];  Service: Gynecology;  Laterality: N/A;  . cold knife conization  1975   abnormal pap  . COLONOSCOPY    . CYSTOSCOPY N/A 11/26/2016   Procedure: CYSTOSCOPY;  Surgeon: Nunzio Cobbs, MD;  Location: [facility];  Service: Gynecology;  Laterality: N/A;  . ENDOMETRIAL BIOPSY     simple hyperplasia  . FOOT SURGERY    . HYSTEROSCOPY  03/10/12   resect, D&C secondary PMB  . LAPAROSCOPIC LYSIS OF ADHESIONS N/A 11/26/2016   Procedure: LAPAROSCOPIC LYSIS OF ADHESIONS;  Surgeon: Nunzio Cobbs, MD;  Location: [facility];  Service: Gynecology;  Laterality: N/A;  . LAPAROSCOPIC VAGINAL HYSTERECTOMY WITH SALPINGO OOPHORECTOMY Bilateral 11/26/2016   Procedure: LAPAROSCOPIC ASSISTED VAGINAL HYSTERECTOMY WITH SALPINGO OOPHORECTOMY;  Surgeon: Nunzio Cobbs, MD;  Location: [facility];  Service: Gynecology;  Laterality: Bilateral;  . LASIK    . TUBAL LIGATION      Current Outpatient Prescriptions  Medication Sig Dispense Refill  . Biotin 2500 MCG CAPS Take 2,500 mcg by mouth daily.    . Cholecalciferol (D 2000) 2000 units TABS Take 2,000 Units by mouth daily.    Marland Kitchen  escitalopram (LEXAPRO) 10 MG tablet Take 1 tablet (10 mg total) by mouth daily. 90 tablet 0  . ibuprofen (ADVIL,MOTRIN) 600 MG tablet Take 1 tablet (600 mg total) by mouth every 6 (six) hours as needed (mild pain). 30 tablet 0  . lisinopril (PRINIVIL,ZESTRIL) 10 MG tablet Take 10 mg by mouth daily.    . Multiple Vitamin (MULITIVITAMIN WITH MINERALS) TABS Take 1 tablet by mouth daily.     Marland Kitchen oxyCODONE-acetaminophen (PERCOCET/ROXICET) 5-325 MG tablet Take 1-2 tablets by mouth every 4 (four) hours as needed for severe pain (moderate to severe pain (when tolerating fluids)). 30 tablet 0   No current facility-administered medications  for this visit.      ALLERGIES: Patient has no known allergies.  Family History  Problem Relation Age of Onset  . Hypertension Mother   . Heart failure Father     Social History   Social History  . Marital status: Married    Spouse name: N/A  . Number of children: N/A  . Years of education: N/A   Occupational History  . Not on file.   Social History Main Topics  . Smoking status: Never Smoker  . Smokeless tobacco: Never Used  . Alcohol use No  . Drug use: No  . Sexual activity: Yes    Partners: Male    Birth control/ protection: Post-menopausal, Surgical     Comment: BTSP   Other Topics Concern  . Not on file   Social History Narrative  . No narrative on file    ROS:  Pertinent items are noted in HPI.  PHYSICAL EXAMINATION:    BP 138/76 (BP Location: Right Arm, Patient Position: Sitting, Cuff Size: Normal)   Pulse 84   Temp 98.1 F (36.7 C) (Oral)   Resp 16   Ht 5' 3.5" (1.613 m)   Wt 189 lb (85.7 kg)   LMP 11/05/2011 (Approximate)   BMI 32.95 kg/m     General appearance: alert, cooperative and appears stated age   Abdomen: incision intact.  Abdomen is soft, non-tender, no masses,  no organomegaly    Pelvic: External genitalia:   SP incisions intact with with mild ecchymoses.  No induration.               Urethra:  normal appearing urethra with no masses, tenderness or lesions                           Bimanual Exam:  Uterus:   Absent.   Some agglutination of the vaginal walls, released with digital exam.  Suture lines intact.  No palpable mesh.              Adnexa: no mass, fullness, tenderness            Chaperone was present for exam.  ASSESSMENT  Mild vaginal agglutination post op.  Voiding adequately.  Had elevated WBC in hospital, but was resolving.   PLAN  Discussed final path report. Discussed vaginal agglutination.  She will return for a repeat bimanual exam in one week.  Activity level discussed with patient.  I suspect a lot of  the drainage currently is from her vaginal incisions.  She understands that her sling will tighten with time.  Check CBC with diff now.    An After Visit Summary was printed and given to the patient.

## 2016-12-06 NOTE — Progress Notes (Signed)
GYNECOLOGY  VISIT   HPI: 71 y.o.   Married  Caucasian  female   G2P2 with Patient's last menstrual period was 11/05/2011 (approximate).   here for 2 week follow up status post LAPAROSCOPIC ASSISTED VAGINAL HYSTERECTOMY WITH SALPINGO OOPHORECTOMY (Bilateral Abdomen) ANTERIOR (CYSTOCELE) AND POSTERIOR REPAIR (RECTOCELE) (N/A Vagina ) TRANSVAGINAL TAPE (TVT) PROCEDURE exact midurethral sling (N/A Vagina ) CYSTOSCOPY (N/A Bladder) LAPAROSCOPIC LYSIS OF ADHESIONS (N/A Abdomen).  Had vaginal agglutination at post op visit on 12/02/16.  Has some lower abdominal discomfort.  Mild.  No vaginal bleeding.   Still has some vaginal fluid leaking.  Some leakage with a cough or laugh.  This is very little.  Has some urge to void.   GYNECOLOGIC HISTORY: Patient's last menstrual period was 11/05/2011 (approximate). Contraception: Tubal/Hysterectomy Menopausal hormone therapy:  None Last mammogram:  10-06-13 Density B/Neg/BiRads1:[facility]  Last pap smear: 10-18-16 Negative           OB History    Gravida Para Term Preterm AB Living   2 2       2    SAB TAB Ectopic Multiple Live Births                     Patient Active Problem List   Diagnosis Date Noted  . Status post laparoscopy-assisted vaginal hysterectomy 11/26/2016  . Hypertension     Past Medical History:  Diagnosis Date  . Anxiety   . Arthritis   . Depression   . Elevated cholesterol   . Hypertension   . No pertinent past medical history   . PMB (postmenopausal bleeding) 03/10/2012    Past Surgical History:  Procedure Laterality Date  . ANTERIOR AND POSTERIOR REPAIR N/A 11/26/2016   Procedure: ANTERIOR (CYSTOCELE) AND POSTERIOR REPAIR (RECTOCELE);  Surgeon: Nunzio Cobbs, MD;  Location: [facility];  Service: Gynecology;  Laterality: N/A;  . BLADDER SUSPENSION N/A 11/26/2016   Procedure: TRANSVAGINAL TAPE (TVT) PROCEDURE exact midurethral sling;  Surgeon: Nunzio Cobbs, MD;  Location: [facility];   Service: Gynecology;  Laterality: N/A;  . cold knife conization  1975   abnormal pap  . COLONOSCOPY    . CYSTOSCOPY N/A 11/26/2016   Procedure: CYSTOSCOPY;  Surgeon: Nunzio Cobbs, MD;  Location: [facility];  Service: Gynecology;  Laterality: N/A;  . ENDOMETRIAL BIOPSY     simple hyperplasia  . FOOT SURGERY    . HYSTEROSCOPY  03/10/12   resect, D&C secondary PMB  . LAPAROSCOPIC LYSIS OF ADHESIONS N/A 11/26/2016   Procedure: LAPAROSCOPIC LYSIS OF ADHESIONS;  Surgeon: Nunzio Cobbs, MD;  Location: [facility];  Service: Gynecology;  Laterality: N/A;  . LAPAROSCOPIC VAGINAL HYSTERECTOMY WITH SALPINGO OOPHORECTOMY Bilateral 11/26/2016   Procedure: LAPAROSCOPIC ASSISTED VAGINAL HYSTERECTOMY WITH SALPINGO OOPHORECTOMY;  Surgeon: Nunzio Cobbs, MD;  Location: [facility];  Service: Gynecology;  Laterality: Bilateral;  . LASIK    . TUBAL LIGATION      Current Outpatient Prescriptions  Medication Sig Dispense Refill  . Biotin 2500 MCG CAPS Take 2,500 mcg by mouth daily.    . Cholecalciferol (D 2000) 2000 units TABS Take 2,000 Units by mouth daily.    Marland Kitchen escitalopram (LEXAPRO) 10 MG tablet Take 1 tablet (10 mg total) by mouth daily. 90 tablet 0  . ibuprofen (ADVIL,MOTRIN) 600 MG tablet Take 1 tablet (600 mg total) by mouth every 6 (six) hours as needed (mild pain). 30 tablet 0  . lisinopril (  PRINIVIL,ZESTRIL) 20 MG tablet Take 10 mg by mouth daily.    . Multiple Vitamin (MULITIVITAMIN WITH MINERALS) TABS Take 1 tablet by mouth daily.     Marland Kitchen oxyCODONE-acetaminophen (PERCOCET/ROXICET) 5-325 MG tablet Take 1-2 tablets by mouth every 4 (four) hours as needed for severe pain (moderate to severe pain (when tolerating fluids)). 30 tablet 0   No current facility-administered medications for this visit.      ALLERGIES: Patient has no known allergies.  Family History  Problem Relation Age of Onset  . Hypertension Mother   . Heart failure Father     Social History   Social History   . Marital status: Married    Spouse name: N/A  . Number of children: N/A  . Years of education: N/A   Occupational History  . Not on file.   Social History Main Topics  . Smoking status: Never Smoker  . Smokeless tobacco: Never Used  . Alcohol use No  . Drug use: No  . Sexual activity: Yes    Partners: Male    Birth control/ protection: Post-menopausal, Surgical     Comment: BTSP   Other Topics Concern  . Not on file   Social History Narrative  . No narrative on file    ROS:  Pertinent items are noted in HPI.  PHYSICAL EXAMINATION:    BP 104/62 (BP Location: Right Arm, Patient Position: Sitting, Cuff Size: Normal)   Pulse 68   Resp 16   Ht 5' 3.5" (1.613 m)   Wt 189 lb (85.7 kg)   LMP 11/05/2011 (Approximate)   BMI 32.95 kg/m     General appearance: alert, cooperative and appears stated age   Abdomen: incisions intact, minor ecchymoses of left lower quadrant incision, soft, non-tender, no masses,  no organomegaly   Pelvic: External genitalia:  SP incisions intact.               Urethra:  normal appearing urethra with no masses, tenderness or lesions                          Bimanual Exam:  Uterus:  Absent.   No vaginal wall agglutination.   Good support suture lines intact and sling protected.               Adnexa: no mass, fullness, tenderness            Chaperone was present for exam.  ASSESSMENT  Urinary urgency.  Mild GSI post op.   PLAN  Sent UC.  Discussed the tightening of the sling with time.  Discussed possible medication for urgency if persists and no UTI.  Will return in 4 weeks.    An After Visit Summary was printed and given to the patient.

## 2016-12-09 ENCOUNTER — Ambulatory Visit: Payer: Medicare Other | Admitting: Obstetrics and Gynecology

## 2016-12-09 ENCOUNTER — Ambulatory Visit (INDEPENDENT_AMBULATORY_CARE_PROVIDER_SITE_OTHER): Payer: Medicare Other | Admitting: Obstetrics and Gynecology

## 2016-12-09 ENCOUNTER — Encounter: Payer: Self-pay | Admitting: Obstetrics and Gynecology

## 2016-12-09 VITALS — BP 104/62 | HR 68 | Resp 16 | Ht 63.5 in | Wt 189.0 lb

## 2016-12-09 DIAGNOSIS — R3915 Urgency of urination: Secondary | ICD-10-CM

## 2016-12-09 DIAGNOSIS — Z9889 Other specified postprocedural states: Secondary | ICD-10-CM

## 2016-12-10 LAB — URINE CULTURE: Organism ID, Bacteria: NO GROWTH

## 2016-12-11 ENCOUNTER — Telehealth: Payer: Self-pay | Admitting: Obstetrics and Gynecology

## 2016-12-11 NOTE — Telephone Encounter (Signed)
Dr. Quincy Simmonds -patient requesting urine culture results dated 2/5, please advise.

## 2016-12-11 NOTE — Telephone Encounter (Signed)
Spoke with patient, advised of results and recommendations as seen below per Dr. Silva. Patient verbalizes understanding and is agreeable.  Routing to provider for final review. Patient is agreeable to disposition. Will close encounter.  

## 2016-12-11 NOTE — Telephone Encounter (Signed)
Patient is calling for her recent urine culture results. °

## 2016-12-11 NOTE — Telephone Encounter (Signed)
Please inform of negative urine culture.  Keep 6 week post op visit.

## 2017-01-08 ENCOUNTER — Encounter: Payer: Self-pay | Admitting: Obstetrics and Gynecology

## 2017-01-08 ENCOUNTER — Ambulatory Visit (INDEPENDENT_AMBULATORY_CARE_PROVIDER_SITE_OTHER): Payer: Medicare Other | Admitting: Obstetrics and Gynecology

## 2017-01-08 VITALS — BP 134/82 | HR 70 | Ht 63.5 in | Wt 188.0 lb

## 2017-01-08 DIAGNOSIS — Z9889 Other specified postprocedural states: Secondary | ICD-10-CM
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
## 2017-01-08 NOTE — Progress Notes (Signed)
Spoke with Anderson Malta at [facility]. Patient scheduled while in office for screening MMG and BMD on 02/11/17 arriving at 2:15pm for 2:30pm appointment time. Patient is agreeable to date and time.

## 2017-02-11 ENCOUNTER — Ambulatory Visit
Admission: RE | Admit: 2017-02-11 | Discharge: 2017-02-11 | Disposition: A | Discharge status: Home / Self Care | Payer: Medicare Other | Source: Ambulatory Visit | Attending: Obstetrics and Gynecology | Admitting: Obstetrics and Gynecology

## 2017-02-11 DIAGNOSIS — Z1239 Encounter for other screening for malignant neoplasm of breast: Secondary | ICD-10-CM

## 2017-02-11 DIAGNOSIS — M8580 Other specified disorders of bone density and structure, unspecified site: Secondary | ICD-10-CM

## 2017-02-12 ENCOUNTER — Encounter: Payer: Self-pay | Admitting: *Deleted
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
## 2017-02-19 ENCOUNTER — Ambulatory Visit (INDEPENDENT_AMBULATORY_CARE_PROVIDER_SITE_OTHER): Payer: Medicare Other | Admitting: Obstetrics and Gynecology

## 2017-02-19 ENCOUNTER — Encounter: Payer: Self-pay | Admitting: Obstetrics and Gynecology

## 2017-02-19 VITALS — BP 140/68 | HR 88 | Resp 20 | Ht 63.75 in | Wt 186.8 lb

## 2017-02-19 DIAGNOSIS — N951 Menopausal and female climacteric states: Secondary | ICD-10-CM

## 2017-02-19 DIAGNOSIS — N393 Stress incontinence (female) (male): Secondary | ICD-10-CM

## 2017-02-19 DIAGNOSIS — M8580 Other specified disorders of bone density and structure, unspecified site: Secondary | ICD-10-CM | POA: Diagnosis not present

## 2017-02-19 DIAGNOSIS — Z9889 Other specified postprocedural states: Secondary | ICD-10-CM | POA: Diagnosis not present

## 2017-02-19 MED ORDER — ESTROGENS, CONJUGATED 0.625 MG/GM VA CREA: TOPICAL_CREAM | VAGINAL | 1 refills | Status: DC

## 2017-02-19 NOTE — Patient Instructions (Signed)
Menopause Menopause is the normal time of life when menstrual periods stop completely. Menopause is complete when you have missed 12 consecutive menstrual periods. It usually occurs between the ages of 63 years and 78 years. Very rarely does a woman develop menopause before the age of 74 years. At menopause, your ovaries stop producing the female hormones estrogen and progesterone. This can cause undesirable symptoms and also affect your health. Sometimes the symptoms may occur 4-5 years before the menopause begins. There is no relationship between menopause and:  Oral contraceptives.  Number of children you had.  Race.  The age your menstrual periods started (menarche). Heavy smokers and very thin women may develop menopause earlier in life. What are the causes?  The ovaries stop producing the female hormones estrogen and progesterone. Other causes include:  Surgery to remove both ovaries.  The ovaries stop functioning for no known reason.  Tumors of the pituitary gland in the brain.  Medical disease that affects the ovaries and hormone production.  Radiation treatment to the abdomen or pelvis.  Chemotherapy that affects the ovaries. What are the signs or symptoms?  Hot flashes.  Night sweats.  Decrease in sex drive.  Vaginal dryness and thinning of the vagina causing painful intercourse.  Dryness of the skin and developing wrinkles.  Headaches.  Tiredness.  Irritability.  Memory problems.  Weight gain.  Bladder infections.  Hair growth of the face and chest.  Infertility. More serious symptoms include:  Loss of bone (osteoporosis) causing breaks (fractures).  Depression.  Hardening and narrowing of the arteries (atherosclerosis) causing heart attacks and strokes. How is this diagnosed?  When the menstrual periods have stopped for 12 straight months.  Physical exam.  Hormone studies of the blood. How is this treated? There are many treatment  choices and nearly as many questions about them. The decisions to treat or not to treat menopausal changes is an individual choice made with your health care provider. Your health care provider can discuss the treatments with you. Together, you can decide which treatment will work best for you. Your treatment choices may include:  Hormone therapy (estrogen and progesterone).  Non-hormonal medicines.  Treating the individual symptoms with medicine (for example antidepressants for depression).  Herbal medicines that may help specific symptoms.  Counseling by a psychiatrist or psychologist.  Group therapy.  Lifestyle changes including:  Eating healthy.  Regular exercise.  Limiting caffeine and alcohol.  Stress management and meditation.  No treatment. Follow these instructions at home:  Take the medicine your health care provider gives you as directed.  Get plenty of sleep and rest.  Exercise regularly.  Eat a diet that contains calcium (good for the bones) and soy products (acts like estrogen hormone).  Avoid alcoholic beverages.  Do not smoke.  If you have hot flashes, dress in layers.  Take supplements, calcium, and vitamin D to strengthen bones.  You can use over-the-counter lubricants or moisturizers for vaginal dryness.  Group therapy is sometimes very helpful.  Acupuncture may be helpful in some cases. Contact a health care provider if:  You are not sure you are in menopause.  You are having menopausal symptoms and need advice and treatment.  You are still having menstrual periods after age 32 years.  You have pain with intercourse.  Menopause is complete (no menstrual period for 12 months) and you develop vaginal bleeding.  You need a referral to a specialist (gynecologist, psychiatrist, or psychologist) for treatment. Get help right away if:  You  have severe depression.  You have excessive vaginal bleeding.  You fell and think you have a broken  bone.  You have pain when you urinate.  You develop leg or chest pain.  You have a fast pounding heart beat (palpitations).  You have severe headaches.  You develop vision problems.  You feel a lump in your breast.  You have abdominal pain or severe indigestion. This information is not intended to replace advice given to you by your health care provider. Make sure you discuss any questions you have with your health care provider. Document Released: 01/11/2004 Document Revised: 03/28/2016 Document Reviewed: 05/20/2013 Elsevier Interactive Patient Education  2017 [employer].  Treatment options may include Gabapentin, Catapress, selective serotonin reuptake inhibitors (which does include Lexapro, Paxil, and Cymbalta - which helps the bladder control), and herbal products such as black cohosh and soy.

## 2017-04-11 NOTE — Addendum Note (Signed)
Addendum  created 04/11/17 [id number] by Lyn Hollingshead, MD   Sign clinical note

## 2017-05-21 ENCOUNTER — Ambulatory Visit: Payer: Medicare Other | Admitting: Obstetrics and Gynecology

## 2017-05-28 ENCOUNTER — Ambulatory Visit (INDEPENDENT_AMBULATORY_CARE_PROVIDER_SITE_OTHER): Payer: Medicare Other | Admitting: Obstetrics and Gynecology

## 2017-05-28 ENCOUNTER — Encounter: Payer: Self-pay | Admitting: Obstetrics and Gynecology

## 2017-05-28 VITALS — BP 132/76 | HR 80 | Resp 16 | Wt 185.0 lb

## 2017-05-28 DIAGNOSIS — N393 Stress incontinence (female) (male): Secondary | ICD-10-CM

## 2017-05-28 DIAGNOSIS — N952 Postmenopausal atrophic vaginitis: Secondary | ICD-10-CM | POA: Diagnosis not present

## 2017-05-28 MED ORDER — NONFORMULARY OR COMPOUNDED ITEM: 2 refills | Status: DC
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
## 2017-05-28 NOTE — Patient Instructions (Signed)

## 2017-08-29 ENCOUNTER — Telehealth: Payer: Self-pay | Admitting: Obstetrics and Gynecology

## 2017-08-29 ENCOUNTER — Encounter: Payer: Self-pay | Admitting: Obstetrics and Gynecology

## 2017-08-29 ENCOUNTER — Ambulatory Visit: Payer: Medicare Other | Admitting: Obstetrics and Gynecology

## 2017-08-29 NOTE — Progress Notes (Deleted)
GYNECOLOGY  VISIT   HPI: 71 y.o.   Married  Caucasian  female   G2P2 with Patient's last menstrual period was 11/05/2011 (approximate).   here for     GYNECOLOGIC HISTORY: Patient's last menstrual period was 11/05/2011 (approximate). Contraception:  Tubal/Hysterectomy Menopausal hormone therapy:  none Last mammogram:   02-11-17 Density C/Neg/BiRads1:TBC Last pap smear:  10-18-16 Neg; 04-07-12 Neg        OB History    Gravida Para Term Preterm AB Living   2 2       2    SAB TAB Ectopic Multiple Live Births                     Patient Active Problem List   Diagnosis Date Noted  . Status post laparoscopy-assisted vaginal hysterectomy 11/26/2016  . Hypertension     Past Medical History:  Diagnosis Date  . Anxiety   . Arthritis   . Basal cell carcinoma   . Depression   . Elevated cholesterol   . Hypertension   . No pertinent past medical history   . Osteopenia of hip 2018  . PMB (postmenopausal bleeding) 03/10/2012    Past Surgical History:  Procedure Laterality Date  . ANTERIOR AND POSTERIOR REPAIR N/A 11/26/2016   Procedure: ANTERIOR (CYSTOCELE) AND POSTERIOR REPAIR (RECTOCELE);  Surgeon: Nunzio Cobbs, MD;  Location: [facility];  Service: Gynecology;  Laterality: N/A;  . BLADDER SUSPENSION N/A 11/26/2016   Procedure: TRANSVAGINAL TAPE (TVT) PROCEDURE exact midurethral sling;  Surgeon: Nunzio Cobbs, MD;  Location: [facility];  Service: Gynecology;  Laterality: N/A;  . cold knife conization  1975   abnormal pap  . COLONOSCOPY    . CYSTOSCOPY N/A 11/26/2016   Procedure: CYSTOSCOPY;  Surgeon: Nunzio Cobbs, MD;  Location: [facility];  Service: Gynecology;  Laterality: N/A;  . ENDOMETRIAL BIOPSY     simple hyperplasia  . FOOT SURGERY    . HYSTEROSCOPY  03/10/12   resect, D&C secondary PMB  . LAPAROSCOPIC LYSIS OF ADHESIONS N/A 11/26/2016   Procedure: LAPAROSCOPIC LYSIS OF ADHESIONS;  Surgeon: Nunzio Cobbs, MD;  Location: [facility];  Service:  Gynecology;  Laterality: N/A;  . LAPAROSCOPIC VAGINAL HYSTERECTOMY WITH SALPINGO OOPHORECTOMY Bilateral 11/26/2016   Procedure: LAPAROSCOPIC ASSISTED VAGINAL HYSTERECTOMY WITH SALPINGO OOPHORECTOMY;  Surgeon: Nunzio Cobbs, MD;  Location: [facility];  Service: Gynecology;  Laterality: Bilateral;  . LASIK    . TUBAL LIGATION      Current Outpatient Prescriptions  Medication Sig Dispense Refill  . Biotin 2500 MCG CAPS Take 2,500 mcg by mouth daily.    . Cholecalciferol (D 2000) 2000 units TABS Take 2,000 Units by mouth daily.    Marland Kitchen escitalopram (LEXAPRO) 10 MG tablet Take 1 tablet (10 mg total) by mouth daily. 90 tablet 0  . losartan (COZAAR) 50 MG tablet Take 1 tablet by mouth daily.    . Multiple Vitamin (MULITIVITAMIN WITH MINERALS) TABS Take 1 tablet by mouth daily.     . NONFORMULARY OR COMPOUNDED ITEM Vaginal estradiol cream 0.02%.  Place 1/2 gram pv at hs for 2 weeks.  Then 1/2 gram pv at hs twice weekly.  Dispense 8 gram tube. 1 each 2   No current facility-administered medications for this visit.      ALLERGIES: Patient has no known allergies.  Family History  Problem Relation Age of Onset  . Hypertension Mother   . Heart failure Father  Social History   Social History  . Marital status: Married    Spouse name: N/A  . Number of children: N/A  . Years of education: N/A   Occupational History  . Not on file.   Social History Main Topics  . Smoking status: Never Smoker  . Smokeless tobacco: Never Used  . Alcohol use No  . Drug use: No  . Sexual activity: Yes    Partners: Male    Birth control/ protection: Post-menopausal, Surgical     Comment: BTSP/Hyst   Other Topics Concern  . Not on file   Social History Narrative  . No narrative on file    ROS:  Pertinent items are noted in HPI.  PHYSICAL EXAMINATION:    LMP 11/05/2011 (Approximate)     General appearance: alert, cooperative and appears stated age Head: Normocephalic, without obvious  abnormality, atraumatic Neck: no adenopathy, supple, symmetrical, trachea midline and thyroid normal to inspection and palpation Lungs: clear to auscultation bilaterally Breasts: normal appearance, no masses or tenderness, No nipple retraction or dimpling, No nipple discharge or bleeding, No axillary or supraclavicular adenopathy Heart: regular rate and rhythm Abdomen: soft, non-tender, no masses,  no organomegaly Extremities: extremities normal, atraumatic, no cyanosis or edema Skin: Skin color, texture, turgor normal. No rashes or lesions Lymph nodes: Cervical, supraclavicular, and axillary nodes normal. No abnormal inguinal nodes palpated Neurologic: Grossly normal  Pelvic: External genitalia:  no lesions              Urethra:  normal appearing urethra with no masses, tenderness or lesions              Bartholins and Skenes: normal                 Vagina: normal appearing vagina with normal color and discharge, no lesions              Cervix: no lesions                Bimanual Exam:  Uterus:  normal size, contour, position, consistency, mobility, non-tender              Adnexa: no mass, fullness, tenderness              Rectal exam: {yes no:[id number]}.  Confirms.              Anus:  normal sphincter tone, no lesions  Chaperone was present for exam.  ASSESSMENT     PLAN     An After Visit Summary was printed and given to the patient.  ______ minutes face to face time of which over 50% was spent in counseling.

## 2017-08-29 NOTE — Telephone Encounter (Signed)
I called the patient because she missed her appointment today for a 3 month recheck. She said she forgot about it and rescheduled to Monday, 09/01/17, with Dr. Quincy Simmonds.

## 2017-09-01 ENCOUNTER — Ambulatory Visit: Payer: Medicare Other | Admitting: Obstetrics and Gynecology

## 2017-09-01 ENCOUNTER — Telehealth: Payer: Self-pay | Admitting: Obstetrics and Gynecology

## 2017-09-01 NOTE — Telephone Encounter (Signed)
Patient cancelled recheck appointment today. She twisted or sprained her foot and is going to doctor for x-rays. Rescheduled to 09/08/17 at 12:45. No dnka letter mailed.

## 2017-09-01 NOTE — Telephone Encounter (Signed)
Thank you for the update!

## 2017-09-05 NOTE — Progress Notes (Signed)
GYNECOLOGY  VISIT   HPI: 71 y.o.   Married  Caucasian  female   G2P2 with Patient's last menstrual period was 11/05/2011 (approximate).   here for 3 month follow up.   Did PT with Ileana Roup following her LAVH/BSO/Ant and post repair with TVT/cysto (11/26/16) for post op GSI.   Still leaking urine with sneezing and coughing.  She does not feel dry.  She did get some improvement in her urinary incontinence.   No urgency or frequency.  No urge with running water or key in lock.   Wearing pads.   GYNECOLOGIC HISTORY: Patient's last menstrual period was 11/05/2011 (approximate). Contraception:  Tubal/Hysterectomy Menopausal hormone therapy:  none Last mammogram:  02-11-17 Density C/Neg/BiRads1:TBC Last pap smear:  10-18-16 Neg; 04-07-12 Neg        OB History    Gravida Para Term Preterm AB Living   2 2       2    SAB TAB Ectopic Multiple Live Births                     Patient Active Problem List   Diagnosis Date Noted  . Status post laparoscopy-assisted vaginal hysterectomy 11/26/2016  . Hypertension     Past Medical History:  Diagnosis Date  . Anxiety   . Arthritis   . Basal cell carcinoma   . Depression   . Elevated cholesterol   . Hypertension   . No pertinent past medical history   . Osteopenia of hip 2018  . PMB (postmenopausal bleeding) 03/10/2012    Past Surgical History:  Procedure Laterality Date  . cold knife conization  1975   abnormal pap  . COLONOSCOPY    . ENDOMETRIAL BIOPSY     simple hyperplasia  . FOOT SURGERY    . HYSTEROSCOPY  03/10/12   resect, D&C secondary PMB  . LASIK    . TUBAL LIGATION      Current Outpatient Medications  Medication Sig Dispense Refill  . Biotin 2500 MCG CAPS Take 2,500 mcg by mouth daily.    . Cholecalciferol (D 2000) 2000 units TABS Take 2,000 Units by mouth daily.    Marland Kitchen losartan (COZAAR) 50 MG tablet Take 1 tablet by mouth daily.    . Multiple Vitamin (MULITIVITAMIN WITH MINERALS) TABS Take 1 tablet by mouth  daily.     . NONFORMULARY OR COMPOUNDED ITEM Vaginal estradiol cream 0.02%.  Place 1/2 gram pv at hs for 2 weeks.  Then 1/2 gram pv at hs twice weekly.  Dispense 8 gram tube. 1 each 2  . sertraline (ZOLOFT) 50 MG tablet Take 50 mg daily by mouth.  0   No current facility-administered medications for this visit.      ALLERGIES: Patient has no known allergies.  Family History  Problem Relation Age of Onset  . Hypertension Mother   . Heart failure Father     Social History   Socioeconomic History  . Marital status: Married    Spouse name: Not on file  . Number of children: Not on file  . Years of education: Not on file  . Highest education level: Not on file  Social Needs  . Financial resource strain: Not on file  . Food insecurity - worry: Not on file  . Food insecurity - inability: Not on file  . Transportation needs - medical: Not on file  . Transportation needs - non-medical: Not on file  Occupational History  . Not on file  Tobacco Use  .  Smoking status: Never Smoker  . Smokeless tobacco: Never Used  Substance and Sexual Activity  . Alcohol use: No  . Drug use: No  . Sexual activity: Yes    Partners: Male    Birth control/protection: Post-menopausal, Surgical    Comment: BTSP/Hyst  Other Topics Concern  . Not on file  Social History Narrative  . Not on file    ROS:  Pertinent items are noted in HPI.  PHYSICAL EXAMINATION:    BP 122/82 (BP Location: Left Arm, Patient Position: Sitting, Cuff Size: Normal)   Ht 5' 3.5" (1.613 m)   Wt 183 lb 9.6 oz (83.3 kg)   LMP 11/05/2011 (Approximate)   BMI 32.01 kg/m     General appearance: alert, cooperative and appears stated age  Pelvic: External genitalia:  no lesions              Urethra:  normal appearing urethra with no masses, tenderness or lesions              Bartholins and Skenes: normal                 Vagina: normal appearing vagina with normal color and discharge, no lesions.  Atrophy in upper vagina.                Cervix:  Absent. Bimanual Exam:  Uterus:  Absent.               Adnexa: no mass, fullness, tenderness             Chaperone was present for exam.  ASSESSMENT  Stress incontinence.  LAVH/BSO/Ant and post repair with TVT/cysto (11/26/16).  PLAN  We discussed failure rates of 10 - 15% following midurethral slings, and unfortunately, the patient falls into this category.  She states her incontinence has improved but she is still dependent on pads.  Options for care are the following - Impressa, incontinence dish, periurethral injections, and repeat midurethral sling.   Her care is dependent on her evaluation.  I am recommending she see Dr. Nicki Reaper MacDiarmid at [facility] for further evaluation and treatment.    An After Visit Summary was printed and given to the patient.  ___15___ minutes face to face time of which over 50% was spent in counseling.

## 2017-09-08 ENCOUNTER — Ambulatory Visit (INDEPENDENT_AMBULATORY_CARE_PROVIDER_SITE_OTHER): Payer: Medicare Other | Admitting: Obstetrics and Gynecology

## 2017-09-08 ENCOUNTER — Encounter: Payer: Self-pay | Admitting: Obstetrics and Gynecology

## 2017-09-08 VITALS — BP 122/82 | Ht 63.5 in | Wt 183.6 lb

## 2017-09-08 DIAGNOSIS — N393 Stress incontinence (female) (male): Secondary | ICD-10-CM | POA: Diagnosis not present

## 2017-11-10 ENCOUNTER — Ambulatory Visit: Payer: Medicare Other | Admitting: Obstetrics and Gynecology

## 2017-12-03 IMAGING — MG 2D DIGITAL SCREENING BILATERAL MAMMOGRAM WITH CAD AND ADJUNCT TO
8 of 12 series · 8 of 28 positions shown · non-contrast
Comparison: Previous exam(s).

CLINICAL DATA: Screening.

EXAM:
2D DIGITAL SCREENING BILATERAL MAMMOGRAM WITH CAD AND ADJUNCT TOMO

[L MLO]
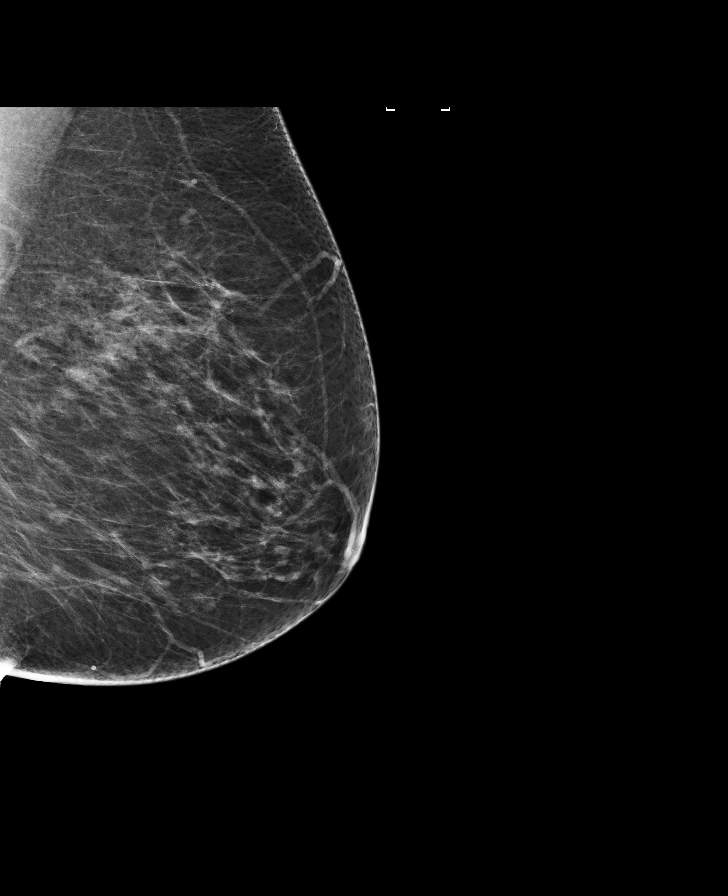

[R CC synth-2D]
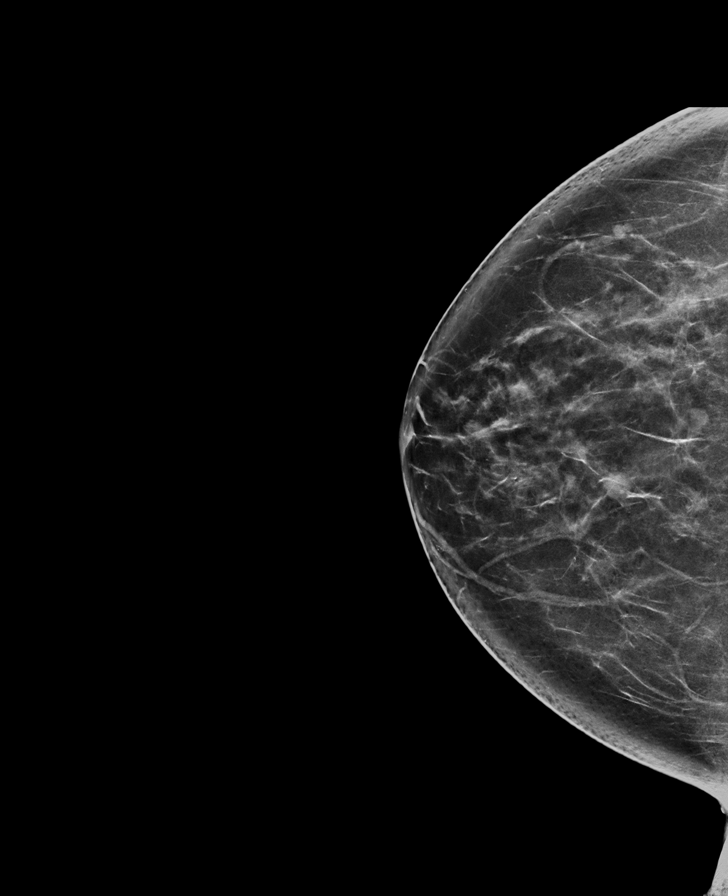

[L CC]
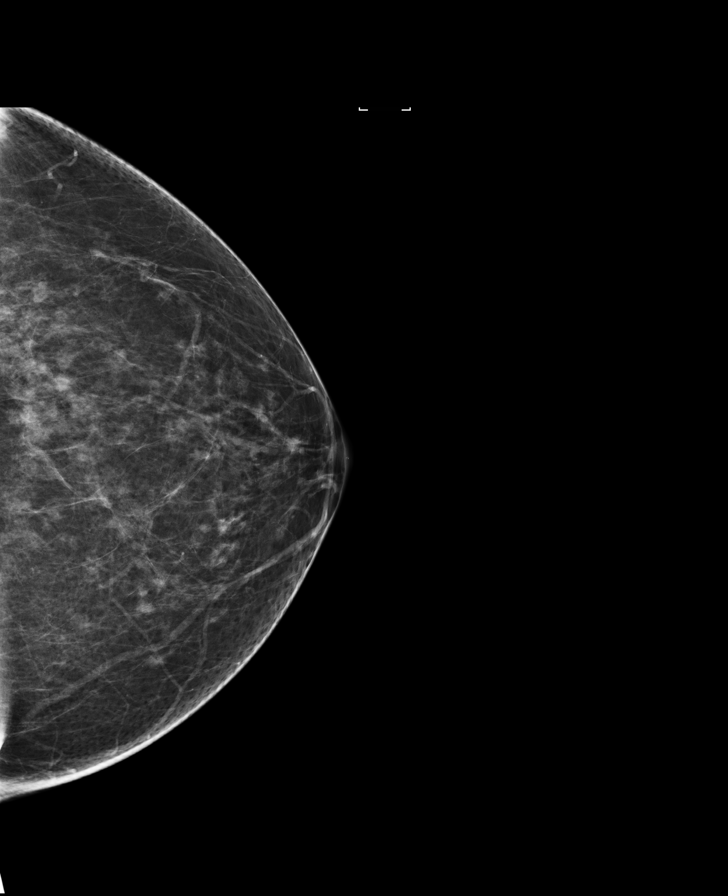

[L MLO synth-2D]
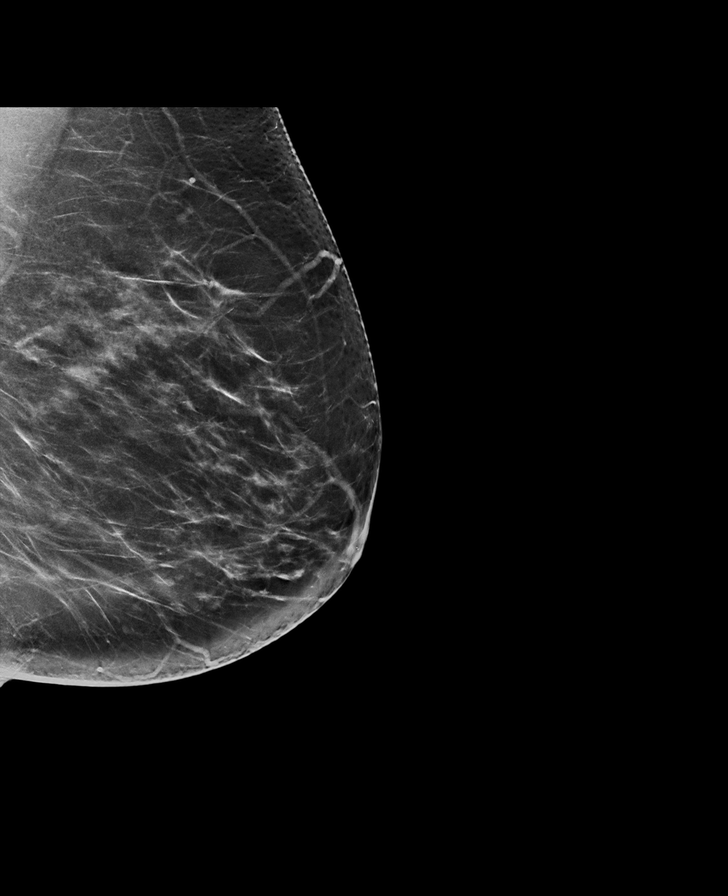

[L CC synth-2D]
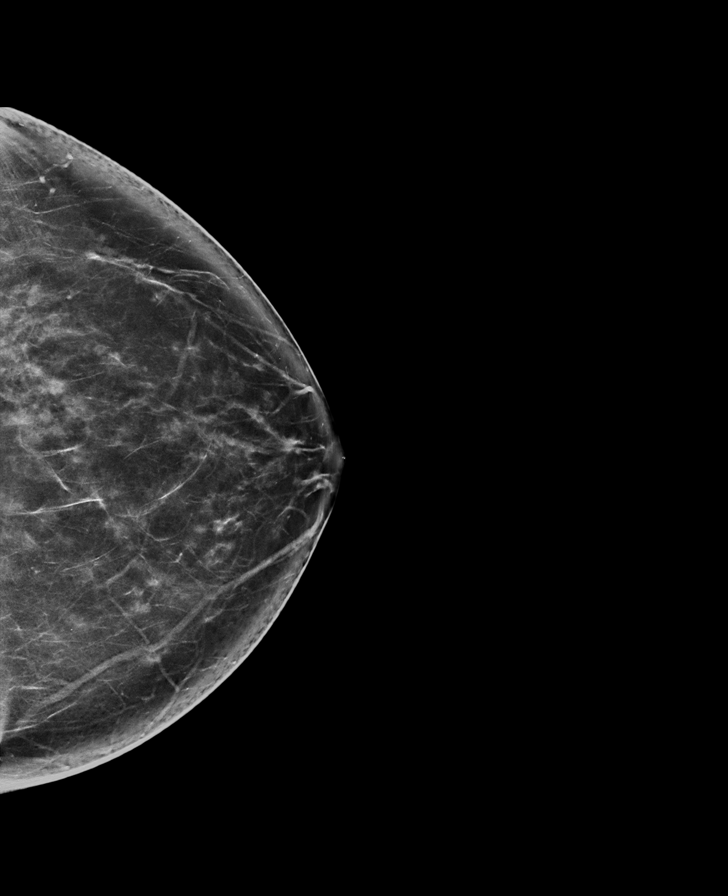

[R CC]
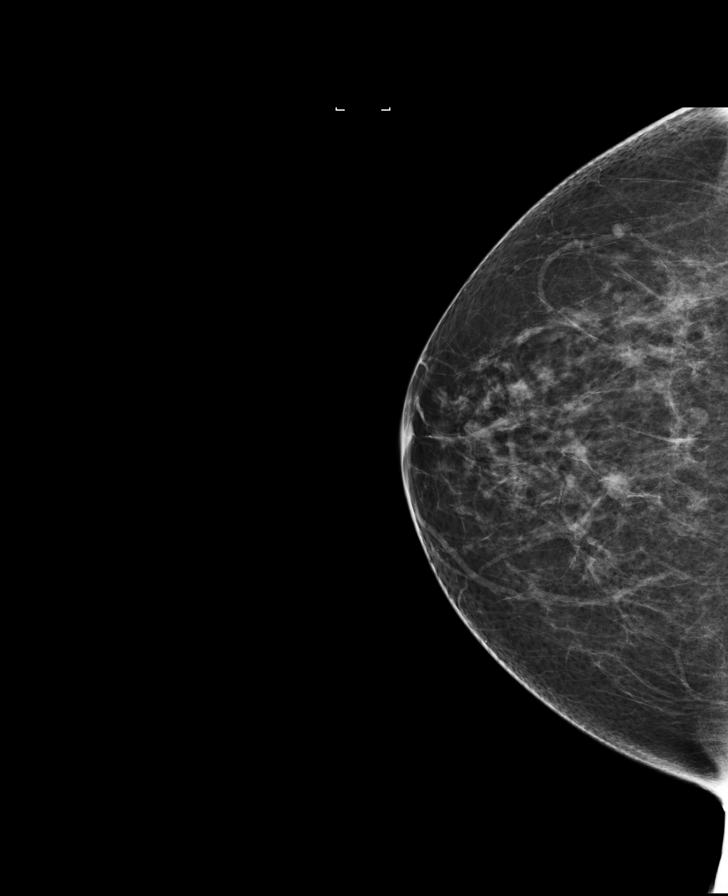

[R MLO synth-2D]
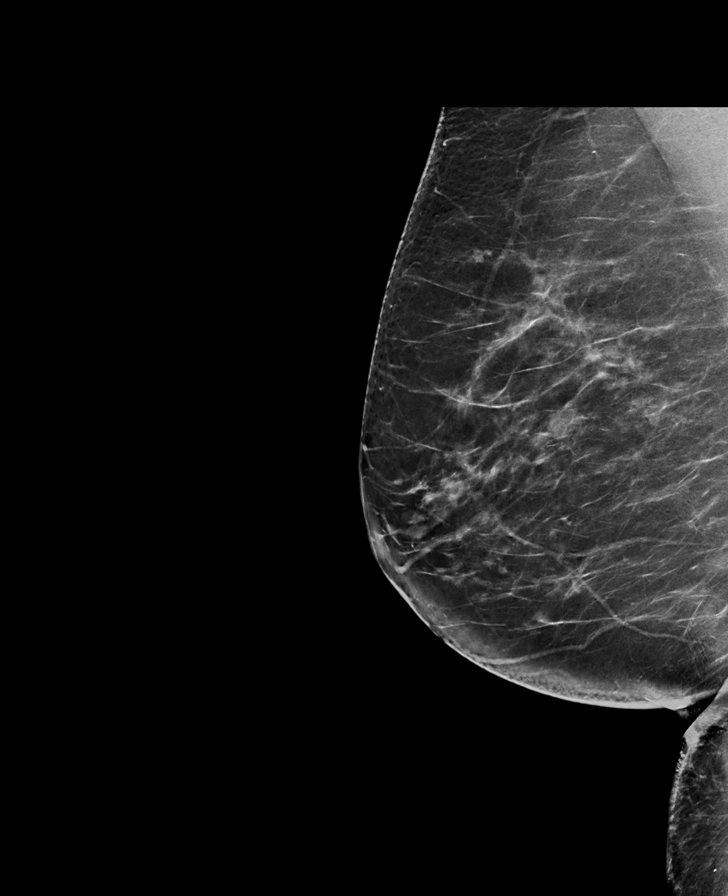

[R MLO]
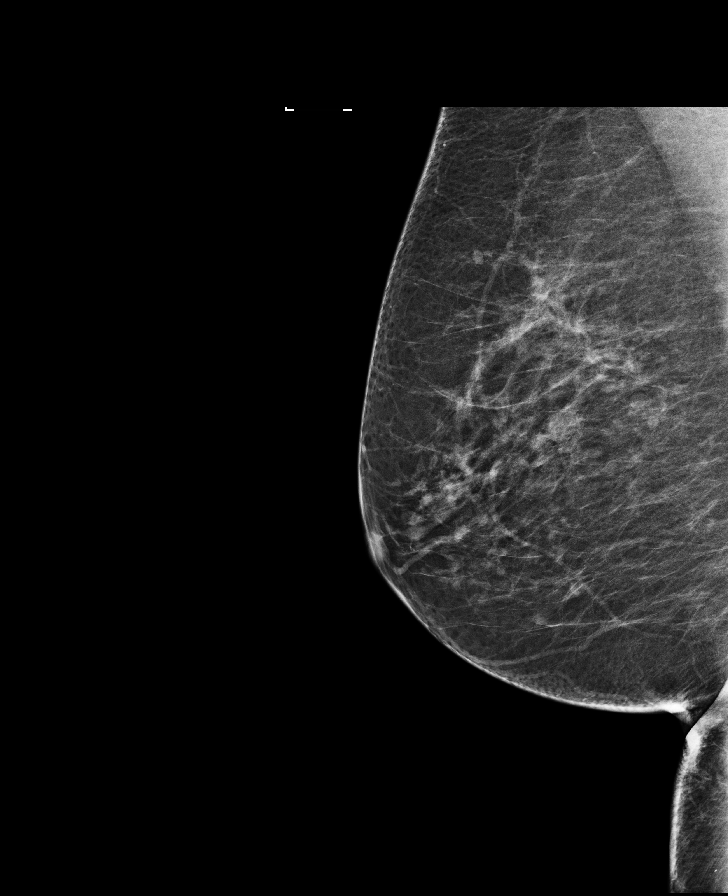

[8 of 28 positions shown; findings below may reference images not displayed]

ACR Breast Density Category c: The breast tissue is heterogeneously
dense, which may obscure small masses.
FINDINGS: There are no findings suspicious for malignancy. Images were
processed with CAD.
IMPRESSION: No mammographic evidence of malignancy. A result letter of this
screening mammogram will be mailed directly to the patient.

RECOMMENDATION:
Screening mammogram in one year. (Code:TN-[id number])

BI-RADS CATEGORY  1: Negative.

## 2017-12-29 ENCOUNTER — Ambulatory Visit: Payer: Medicare Other | Admitting: Obstetrics and Gynecology

## 2018-06-29 ENCOUNTER — Other Ambulatory Visit: Payer: Self-pay | Admitting: Obstetrics and Gynecology

## 2018-06-29 DIAGNOSIS — Z1231 Encounter for screening mammogram for malignant neoplasm of breast: Secondary | ICD-10-CM

## 2018-07-28 ENCOUNTER — Ambulatory Visit: Payer: Medicare Other

## 2018-07-28 ENCOUNTER — Ambulatory Visit
Admission: RE | Admit: 2018-07-28 | Discharge: 2018-07-28 | Disposition: A | Discharge status: Home / Self Care | Payer: Self-pay | Source: Ambulatory Visit | Attending: Obstetrics and Gynecology | Admitting: Obstetrics and Gynecology

## 2018-07-28 DIAGNOSIS — Z1231 Encounter for screening mammogram for malignant neoplasm of breast: Secondary | ICD-10-CM

## 2018-10-23 ENCOUNTER — Ambulatory Visit: Payer: Medicare Other | Admitting: Obstetrics and Gynecology

## 2018-11-09 ENCOUNTER — Ambulatory Visit: Payer: Medicare Other | Admitting: Obstetrics and Gynecology

## 2018-11-10 ENCOUNTER — Ambulatory Visit: Payer: Medicare Other | Admitting: Obstetrics and Gynecology

## 2018-11-19 ENCOUNTER — Other Ambulatory Visit: Payer: Self-pay

## 2018-11-19 ENCOUNTER — Encounter: Payer: Self-pay | Admitting: Obstetrics and Gynecology

## 2018-11-19 ENCOUNTER — Ambulatory Visit (INDEPENDENT_AMBULATORY_CARE_PROVIDER_SITE_OTHER): Payer: Medicare Other | Admitting: Obstetrics and Gynecology

## 2018-11-19 VITALS — BP 132/70 | HR 76 | Resp 16 | Ht 63.25 in | Wt 193.0 lb

## 2018-11-19 DIAGNOSIS — Z01419 Encounter for gynecological examination (general) (routine) without abnormal findings: Secondary | ICD-10-CM

## 2018-11-19 DIAGNOSIS — Z124 Encounter for screening for malignant neoplasm of cervix: Secondary | ICD-10-CM | POA: Diagnosis not present

## 2018-11-19 DIAGNOSIS — M8580 Other specified disorders of bone density and structure, unspecified site: Secondary | ICD-10-CM

## 2018-11-19 DIAGNOSIS — Z780 Asymptomatic menopausal state: Secondary | ICD-10-CM

## 2018-11-19 MED ORDER — NONFORMULARY OR COMPOUNDED ITEM: 11 refills | Status: AC

## 2018-11-19 NOTE — Patient Instructions (Signed)

## 2018-11-19 NOTE — Progress Notes (Signed)
73 y.o. G2P2 Married Caucasian female here for annual exam.    States she has urinary incontinence with a sneeze.  Leaks less often, less frequently and less volume but she is not 100% dry.  Wears a pad most days.  Some days she has leakage but it does not disturb her. No spontaneous leakage or urgency leakage.  Voiding well.  Good bowel function.  Feels a bulge vaginally, but not often.   Not using vaginal estrogen.  No dryness issues.   Taking Zoloft for anxiety.  PCP prescribing.   Had her appointment with her PCP in [place].  Had her blood work done.   PCP:  Dr. Valora Piccolo, [facility] in [place].  Patient's last menstrual period was 11/05/2011 (approximate).           Sexually active: Yes.    The current method of family planning is tubal ligation and status post hysterectomy.    Exercising: Yes.    walking Smoker:  no  Health Maintenance: Pap:  10-18-16 Neg; 04-07-12 Neg History of abnormal Pap:  no MMG:  07/28/18 BIRADS 1 negative/density b Colonoscopy:  05/13/18 polyp removed and diverticulitis; f/u 5 years BMD:   02/11/17  Result  Osteopenia TDaP:  2015 Gardasil:   n/a HIV: never Hep C: never Screening Labs:  PCP   reports that she has never smoked. She has never used smokeless tobacco. She reports that she does not drink alcohol or use drugs.  Past Medical History:  Diagnosis Date  . Anxiety   . Arthritis   . Basal cell carcinoma   . Depression   . Elevated cholesterol   . Hypertension   . No pertinent past medical history   . Osteopenia of hip 2018  . PMB (postmenopausal bleeding) 03/10/2012    Past Surgical History:  Procedure Laterality Date  . ANTERIOR AND POSTERIOR REPAIR N/A 11/26/2016   Procedure: ANTERIOR (CYSTOCELE) AND POSTERIOR REPAIR (RECTOCELE);  Surgeon: Nunzio Cobbs, MD;  Location: [facility];  Service: Gynecology;  Laterality: N/A;  . BLADDER SUSPENSION N/A 11/26/2016   Procedure: TRANSVAGINAL TAPE (TVT) PROCEDURE exact midurethral  sling;  Surgeon: Nunzio Cobbs, MD;  Location: [facility];  Service: Gynecology;  Laterality: N/A;  . cold knife conization  1975   abnormal pap  . COLONOSCOPY    . CYSTOSCOPY N/A 11/26/2016   Procedure: CYSTOSCOPY;  Surgeon: Nunzio Cobbs, MD;  Location: [facility];  Service: Gynecology;  Laterality: N/A;  . ENDOMETRIAL BIOPSY     simple hyperplasia  . FOOT SURGERY    . HYSTEROSCOPY  03/10/12   resect, D&C secondary PMB  . LAPAROSCOPIC LYSIS OF ADHESIONS N/A 11/26/2016   Procedure: LAPAROSCOPIC LYSIS OF ADHESIONS;  Surgeon: Nunzio Cobbs, MD;  Location: [facility];  Service: Gynecology;  Laterality: N/A;  . LAPAROSCOPIC VAGINAL HYSTERECTOMY WITH SALPINGO OOPHORECTOMY Bilateral 11/26/2016   Procedure: LAPAROSCOPIC ASSISTED VAGINAL HYSTERECTOMY WITH SALPINGO OOPHORECTOMY;  Surgeon: Nunzio Cobbs, MD;  Location: [facility];  Service: Gynecology;  Laterality: Bilateral;  . LASIK    . TUBAL LIGATION      Current Outpatient Medications  Medication Sig Dispense Refill  . Biotin 2500 MCG CAPS Take 2,500 mcg by mouth daily.    . Cholecalciferol (D 2000) 2000 units TABS Take 2,000 Units by mouth daily.    Marland Kitchen losartan (COZAAR) 50 MG tablet Take 1 tablet by mouth daily.    . Multiple Vitamin (MULITIVITAMIN WITH MINERALS) TABS Take  1 tablet by mouth daily.     . NONFORMULARY OR COMPOUNDED ITEM Vaginal estradiol cream 0.02%.  Place 1/2 gram pv at hs for 2 weeks.  Then 1/2 gram pv at hs twice weekly.  Dispense 8 gram tube. 1 each 2  . sertraline (ZOLOFT) 50 MG tablet Take 50 mg daily by mouth.  0   No current facility-administered medications for this visit.     Family History  Problem Relation Age of Onset  . Hypertension Mother   . Heart failure Father   . Breast cancer Maternal Aunt        after age 18    Review of Systems  Constitutional: Negative.   HENT: Negative.   Eyes: Negative.   Respiratory: Negative.   Cardiovascular: Negative.   Gastrointestinal:  Negative.   Endocrine: Negative.   Genitourinary: Negative.   Musculoskeletal: Negative.   Skin: Negative.   Allergic/Immunologic: Negative.   Neurological: Negative.   Hematological: Negative.   Psychiatric/Behavioral: Negative.     Exam:   LMP 11/05/2011 (Approximate)     General appearance: alert, cooperative and appears stated age Head: Normocephalic, without obvious abnormality, atraumatic Neck: no adenopathy, supple, symmetrical, trachea midline and thyroid normal to inspection and palpation Lungs: clear to auscultation bilaterally Breasts: normal appearance, no masses or tenderness, No nipple retraction or dimpling, No nipple discharge or bleeding, No axillary or supraclavicular adenopathy Heart: regular rate and rhythm Abdomen: soft, non-tender; no masses, no organomegaly Extremities: extremities normal, atraumatic, no cyanosis or edema Skin: Skin color, texture, turgor normal. No rashes or lesions Lymph nodes: Cervical, supraclavicular, and axillary nodes normal. No abnormal inguinal nodes palpated Neurologic: Grossly normal  Pelvic: External genitalia:  no lesions              Urethra:  normal appearing urethra with no masses, tenderness or lesions              Bartholins and Skenes: normal                 Vagina:  Generalized erythema of vagina and orange discharge.   Goo anterior vaginal wall support and minor rectocele.              Cervix: absent.               Pap taken: No. Bimanual Exam:  Uterus:  Absent.              Adnexa: no mass, fullness, tenderness              Rectal exam: Yes.  .  Confirms.              Anus:  normal sphincter tone, no lesions  Chaperone was present for exam.  Assessment:   Well woman visit with normal exam. Status post laparoscopically assisted vaginal hysterectomy with BSO, anterior and posterior colporrhaphy, TVT Exact midurethral sling, cystoscopy, and lysis of adhesions. Vaginal atrophy.  Minor stress incontinence.  Anxiety.   Osteopenia.   Plan: Mammogram screening. Recommended self breast awareness. Pap and HR HPV as above. Guidelines for Calcium, Vitamin D, regular exercise program including cardiovascular and weight bearing exercise. Rx for Estradiol cream compounded 0.02%.  Instructed in used.  I discussed potential effect on breast cancer.  We reviewed Cymbalta, pelvic floor PT, and periurethral bulking agents for continued stress incontinence.   She will discuss switching from Zoloft to Cymbalta with her PCP.  BMD ordered.  She will call to schedule.  Follow up annually  and prn.   After visit summary provided.

## 2019-11-22 ENCOUNTER — Ambulatory Visit: Payer: Medicare Other | Admitting: Obstetrics and Gynecology

## 2019-11-26 ENCOUNTER — Ambulatory Visit: Payer: Medicare Other | Admitting: Obstetrics and Gynecology

## 2019-12-06 ENCOUNTER — Ambulatory Visit: Payer: Medicare Other | Admitting: Obstetrics and Gynecology

## 2019-12-21 ENCOUNTER — Ambulatory Visit: Payer: Medicare Other | Admitting: Obstetrics and Gynecology
